# Patient Record
Sex: Male | Born: 1965 | Race: White | Hispanic: No | Marital: Married | Smoking: Never smoker
Health system: Southern US, Community
[De-identification: ages and names within clinical notes are randomized; demographics above are authoritative.]

## PROBLEM LIST (undated history)

## (undated) DIAGNOSIS — E119 Type 2 diabetes mellitus without complications: Secondary | ICD-10-CM

## (undated) DIAGNOSIS — E669 Obesity, unspecified: Secondary | ICD-10-CM

## (undated) DIAGNOSIS — M1990 Unspecified osteoarthritis, unspecified site: Secondary | ICD-10-CM

## (undated) DIAGNOSIS — I10 Essential (primary) hypertension: Secondary | ICD-10-CM

## (undated) DIAGNOSIS — I739 Peripheral vascular disease, unspecified: Secondary | ICD-10-CM

## (undated) DIAGNOSIS — E785 Hyperlipidemia, unspecified: Secondary | ICD-10-CM

## (undated) HISTORY — DX: Obesity, unspecified: E66.9

## (undated) HISTORY — PX: WISDOM TOOTH EXTRACTION: SHX21

## (undated) HISTORY — DX: Essential (primary) hypertension: I10

## (undated) HISTORY — DX: Hyperlipidemia, unspecified: E78.5

## (undated) HISTORY — DX: Type 2 diabetes mellitus without complications: E11.9

---

## 2000-03-08 ENCOUNTER — Encounter: Payer: Self-pay | Admitting: Family Medicine

## 2000-03-08 ENCOUNTER — Encounter: Admission: RE | Admit: 2000-03-08 | Discharge: 2000-03-08 | Payer: Self-pay | Admitting: Family Medicine

## 2002-07-06 ENCOUNTER — Emergency Department (HOSPITAL_COMMUNITY): Admission: EM | Admit: 2002-07-06 | Discharge: 2002-07-06 | Payer: Self-pay | Admitting: Emergency Medicine

## 2002-07-08 ENCOUNTER — Encounter: Payer: Self-pay | Admitting: Family Medicine

## 2002-07-08 ENCOUNTER — Ambulatory Visit (HOSPITAL_COMMUNITY): Admission: RE | Admit: 2002-07-08 | Discharge: 2002-07-08 | Payer: Self-pay | Admitting: Family Medicine

## 2003-08-11 ENCOUNTER — Encounter: Admission: RE | Admit: 2003-08-11 | Discharge: 2003-08-11 | Payer: Self-pay | Admitting: Family Medicine

## 2012-06-06 ENCOUNTER — Encounter: Payer: Self-pay | Admitting: Dietician

## 2012-06-06 ENCOUNTER — Encounter: Payer: BC Managed Care – PPO | Attending: Emergency Medicine | Admitting: Dietician

## 2012-06-06 VITALS — Ht 73.5 in | Wt 268.9 lb

## 2012-06-06 DIAGNOSIS — Z713 Dietary counseling and surveillance: Secondary | ICD-10-CM | POA: Insufficient documentation

## 2012-06-06 DIAGNOSIS — E119 Type 2 diabetes mellitus without complications: Secondary | ICD-10-CM

## 2012-06-06 NOTE — Progress Notes (Signed)
Medical Nutrition Therapy:  Appt start time: 0800 end time:  0900.   Assessment:  Primary concerns today: Learn what to eat to prevent having to take medications for diabetes/glucose. Comes with recent history of getting his blood pressure under better control using 3 medications.  Does have lower levels at home than those levels at the MD visit.  Has had a recent increase in his medication for cholesterol.  On 05/15/12 T. Chol=169, HDL= 50 mg, LDL= 96, TG= 115 mg.  HgA1C= 6.6%.    HYPERGLYCEMIA:  Gives no S/S of high blood glucose.  HYPOGLYCEMIA:  Notes no S/S of low blood glucose.  BLOOD GLUCOSE LEVELS:    Fasting: 112-115  After Dinner: 96, 90's-120's.  Did have one episode of post-meal of 190.   MEDICATIONS: Completed review of current medications.  Currently is not on medication for lowering blood glucose.   DIETARY INTAKE:  Usual eating pattern includes 3 meals and 2-3 snacks per day.  24-hr recall:  B ( AM): 7:00cereal  2 cups (raisn bran or multigrain cheerios, milk)  Skim milk 8 oz  Snk ( AM): Banana and coffee with sweet and low.and half and half L ( PM): [place] and cheese sandwich on wheat or white bread, with 100 calorie snack pack or the individual pretzel snack. OR salad or an entree OR fast food with [employer] and 2 cheese burgers and diet coke.  sometimes will go to the [facility] a couple times per week. And will grab fast food on his way back to work. Snk ( PM): Lance crackers or 100 calorie almonds. Water or a coffee. D ( PM): 7-8:00 chicken strips, hamburgers to grill chicken,  (bread, fruit, rice ) trying to do salad.  steak  Pizza at times. Pasta with chicken and sauce.  Tortilla wraps with lettuce, cheese. Water or diet soda. Snk ( PM): ice cream in past popcorn. Beverages: water, coffee, diet soda.  Usual physical activity: [employer] 2 days per week, 30 min on elyptical and stretches, especially for siatic nerve.  Some yard work using [employer], walk with daughter at  [employer] on the weekend.     Estimated energy needs: HT: 71.5 in  WT: 268.9 lb  BMI: 35.1 kg/m2  Adj WT:210 or 200 lb (91 kg) 1800 calories 200-205 g carbohydrates 135-140 g protein 55-60 g fat  Progress Towards Goal(s):  No progress.   Nutritional Diagnosis:  [place]-2.1 Inpaired nutrition utilization As related to blood glucose.  As evidenced by diagnosis of type 2 diabetes, HgA1c at 6.6%, and varying blood glucose levels..    Intervention:  Nutrition Review of the physiology of diabetes and the interventions for lowering blood glucose.  Recommended that he continue his weekly exercise at the [facility] and to try to extend the time to the ADA recommended 150 minutes/week.  Aim to spend time getting some walking or exercise or working activity in on the weekend.  Recommended adding lean protein source at all meals and snacks.  Try using the [employer] web site for fast food meals.  Use the food label and look to increase fiber and to limit sugar to (0-9 gm) per serving.  Try to limit carb servings to 4 servings or 60 gm per meal.  Consider setting a self-weight loss goal.   Handouts given during visit include:  Living Well with Diabetes  Controlling Blood Glucose  Yellow Card with Diet Prescription  Menu with 60 Gm CHO meals.  Snack list  Goal:  Lose some  weight.  Get blood glucose levels down and not go on another medication.  Monitoring/Evaluation:  Dietary intake, exercise, blood glucose levels, and body weight in 8-12 weeks.

## 2012-06-09 ENCOUNTER — Encounter: Payer: Self-pay | Admitting: Dietician

## 2015-06-03 ENCOUNTER — Encounter: Payer: Self-pay | Admitting: Internal Medicine

## 2015-08-03 ENCOUNTER — Ambulatory Visit: Payer: Self-pay | Admitting: *Deleted

## 2015-08-17 ENCOUNTER — Encounter: Payer: Self-pay | Admitting: Internal Medicine

## 2015-08-21 HISTORY — PX: COLONOSCOPY: SHX174

## 2015-09-23 ENCOUNTER — Ambulatory Visit (AMBULATORY_SURGERY_CENTER): Payer: Self-pay | Admitting: *Deleted

## 2015-09-23 VITALS — Ht 74.0 in | Wt 277.8 lb

## 2015-09-23 DIAGNOSIS — Z1211 Encounter for screening for malignant neoplasm of colon: Secondary | ICD-10-CM

## 2015-09-23 MED ORDER — NA SULFATE-K SULFATE-MG SULF 17.5-3.13-1.6 GM/177ML PO SOLN: 1.0000 | Freq: Once | ORAL | Status: DC

## 2015-09-23 NOTE — Progress Notes (Signed)
No egg or soy allergy known to patient  No issues with past sedation with any surgeries  or procedures, no intubation problems  No diet pills per patient No home 02 use per patient  No blood thinners per patient   emmi to    [email or web address]

## 2015-10-14 ENCOUNTER — Ambulatory Visit (AMBULATORY_SURGERY_CENTER): Payer: BLUE CROSS/BLUE SHIELD | Admitting: Internal Medicine

## 2015-10-14 ENCOUNTER — Encounter: Payer: Self-pay | Admitting: Internal Medicine

## 2015-10-14 VITALS — BP 141/93 | HR 60 | Temp 98.9°F | Resp 14 | Wt 277.0 lb

## 2015-10-14 DIAGNOSIS — D122 Benign neoplasm of ascending colon: Secondary | ICD-10-CM

## 2015-10-14 DIAGNOSIS — Z1211 Encounter for screening for malignant neoplasm of colon: Secondary | ICD-10-CM

## 2015-10-14 LAB — GLUCOSE, CAPILLARY
GLUCOSE-CAPILLARY: 115 mg/dL — AB (ref 65–99)
GLUCOSE-CAPILLARY: 101 mg/dL — AB (ref 65–99)

## 2015-10-14 MED ORDER — SODIUM CHLORIDE 0.9 % IV SOLN: 500.0000 mL | INTRAVENOUS | Status: DC

## 2015-10-14 NOTE — Progress Notes (Signed)
Report to PACU, RN, vss, BBS= Clear.  

## 2015-10-14 NOTE — Patient Instructions (Signed)
YOU HAD AN ENDOSCOPIC PROCEDURE TODAY AT THE [facility]:   Refer to the procedure report that was given to you for any specific questions about what was found during the examination.  If the procedure report does not answer your questions, please call your gastroenterologist to clarify.  If you requested that your care partner not be given the details of your procedure findings, then the procedure report has been included in a sealed envelope for you to review at your convenience later.  YOU SHOULD EXPECT: Some feelings of bloating in the abdomen. Passage of more gas than usual.  Walking can help get rid of the air that was put into your GI tract during the procedure and reduce the bloating. If you had a lower endoscopy (such as a colonoscopy or flexible sigmoidoscopy) you may notice spotting of blood in your stool or on the toilet paper. If you underwent a bowel prep for your procedure, you may not have a normal bowel movement for a few days.  Please Note:  You might notice some irritation and congestion in your nose or some drainage.  This is from the oxygen used during your procedure.  There is no need for concern and it should clear up in a day or so.  SYMPTOMS TO REPORT IMMEDIATELY:   Following lower endoscopy (colonoscopy or flexible sigmoidoscopy):  Excessive amounts of blood in the stool  Significant tenderness or worsening of abdominal pains  Swelling of the abdomen that is new, acute  Fever of 100F or higher   For urgent or emergent issues, a gastroenterologist can be reached at any hour by calling [phone].   DIET: Your first meal following the procedure should be a small meal and then it is ok to progress to your normal diet. Heavy or fried foods are harder to digest and may make you feel nauseous or bloated.  Likewise, meals heavy in dairy and vegetables can increase bloating.  Drink plenty of fluids but you should avoid alcoholic beverages for 24  hours.  ACTIVITY:  You should plan to take it easy for the rest of today and you should NOT DRIVE or use heavy machinery until tomorrow (because of the sedation medicines used during the test).    FOLLOW UP: Our staff will call the number listed on your records the next business day following your procedure to check on you and address any questions or concerns that you may have regarding the information given to you following your procedure. If we do not reach you, we will leave a message.  However, if you are feeling well and you are not experiencing any problems, there is no need to return our call.  We will assume that you have returned to your regular daily activities without incident.  If any biopsies were taken you will be contacted by phone or by letter within the next 1-3 weeks.  Please call us at [phone] if you have not heard about the biopsies in 3 weeks.    SIGNATURES/CONFIDENTIALITY: You and/or your care partner have signed paperwork which will be entered into your electronic medical record.  These signatures attest to the fact that that the information above on your After Visit Summary has been reviewed and is understood.  Full responsibility of the confidentiality of this discharge information lies with you and/or your care-partner.  Polyp, diverticulosis, and high fiber diet information given.  

## 2015-10-14 NOTE — Op Note (Signed)
[place]  [address] [place], [address]   COLONOSCOPY PROCEDURE REPORT  PATIENT: Gregory Khan, Gregory Khan  MR#: [id number] BIRTHDATE: 1965-10-10 , 49  yrs. old GENDER: male ENDOSCOPIST: Eustace Quail, MD REFERRED [id number] Kindl, M.D. PROCEDURE DATE:  10/14/2015 PROCEDURE:   Colonoscopy, screening and Colonoscopy with snare polypectomy x 1 First Screening Colonoscopy - Avg.  risk and is 50 yrs.  old or older Yes.  Prior Negative Screening - Now for repeat screening. N/A  History of Adenoma - Now for follow-up colonoscopy & has been > or = to 3 yrs.  N/A  Polyps removed today? Yes ASA CLASS:   Class II INDICATIONS:Screening for colonic neoplasia and Colorectal Neoplasm Risk Assessment for this procedure is average risk. MEDICATIONS: Monitored anesthesia care and Propofol 400 mg IV  DESCRIPTION OF PROCEDURE:   After the risks benefits and alternatives of the procedure were thoroughly explained, informed consent was obtained.  The digital rectal exam revealed no abnormalities of the rectum.   The LB [id number]  endoscope was introduced through the anus and advanced to the cecum, which was identified by both the appendix and ileocecal valve. No adverse events experienced.   The quality of the prep was excellent. (Suprep was used)  The instrument was then slowly withdrawn as the colon was fully examined. Estimated blood loss is zero unless otherwise noted in this procedure report.   COLON FINDINGS: A sessile polyp measuring 6 mm in size was found in the ascending colon.  A polypectomy was performed with a cold snare.  The resection was complete, the polyp tissue was completely retrieved and sent to histology.   There was moderate diverticulosis noted in the left colon.   The examination was otherwise normal.  Retroflexed views revealed internal hemorrhoids. The time to cecum = 3.7 Withdrawal time = 14.0   The scope was withdrawn and the procedure  completed. COMPLICATIONS: There were no immediate complications.  ENDOSCOPIC IMPRESSION: 1.   Sessile polyp was found in the ascending colon; polypectomy was performed with a cold snare 2.   Moderate diverticulosis was noted in the left colon 3.   The examination was otherwise normal  RECOMMENDATIONS: 1. Follow up colonoscopy in 5 years  eSigned:  Eustace Quail, MD 10/14/2015 12:31 PM   cc: The Patient and Ihor Gully, MD

## 2015-10-14 NOTE — Progress Notes (Signed)
Called to room to assist during endoscopic procedure.  Patient ID and intended procedure confirmed with present staff. Received instructions for my participation in the procedure from the performing physician.  

## 2015-10-17 ENCOUNTER — Telehealth: Payer: Self-pay

## 2015-10-17 NOTE — Telephone Encounter (Signed)
  Follow up Call-  Call back number 10/14/2015  Post procedure Call Back phone  # [phone]  Permission to leave phone message Yes     Patient was called after procedure on 10/14/2015. No answer at the number given for follow up phone call. A message was left on the answering machine.

## 2015-10-18 ENCOUNTER — Encounter: Payer: Self-pay | Admitting: Internal Medicine

## 2016-04-21 DIAGNOSIS — S83241A Other tear of medial meniscus, current injury, right knee, initial encounter: Secondary | ICD-10-CM | POA: Diagnosis not present

## 2016-04-30 DIAGNOSIS — S83282A Other tear of lateral meniscus, current injury, left knee, initial encounter: Secondary | ICD-10-CM | POA: Diagnosis not present

## 2016-05-13 DIAGNOSIS — M25562 Pain in left knee: Secondary | ICD-10-CM | POA: Diagnosis not present

## 2016-05-21 ENCOUNTER — Ambulatory Visit
Admission: RE | Admit: 2016-05-21 | Discharge: 2016-05-21 | Disposition: A | Discharge status: Home / Self Care | Payer: BLUE CROSS/BLUE SHIELD | Source: Ambulatory Visit | Attending: Family Medicine | Admitting: Family Medicine

## 2016-05-21 ENCOUNTER — Other Ambulatory Visit: Payer: Self-pay | Admitting: Family Medicine

## 2016-05-21 DIAGNOSIS — Z0000 Encounter for general adult medical examination without abnormal findings: Secondary | ICD-10-CM

## 2016-05-21 DIAGNOSIS — Z01818 Encounter for other preprocedural examination: Secondary | ICD-10-CM | POA: Diagnosis not present

## 2016-05-25 DIAGNOSIS — M94262 Chondromalacia, left knee: Secondary | ICD-10-CM | POA: Diagnosis not present

## 2016-05-25 DIAGNOSIS — M1712 Unilateral primary osteoarthritis, left knee: Secondary | ICD-10-CM | POA: Diagnosis not present

## 2016-05-25 DIAGNOSIS — S83242A Other tear of medial meniscus, current injury, left knee, initial encounter: Secondary | ICD-10-CM | POA: Diagnosis not present

## 2016-05-25 DIAGNOSIS — M23332 Other meniscus derangements, other medial meniscus, left knee: Secondary | ICD-10-CM | POA: Diagnosis not present

## 2016-05-25 DIAGNOSIS — M23362 Other meniscus derangements, other lateral meniscus, left knee: Secondary | ICD-10-CM | POA: Diagnosis not present

## 2016-05-25 DIAGNOSIS — G8918 Other acute postprocedural pain: Secondary | ICD-10-CM | POA: Diagnosis not present

## 2016-05-25 DIAGNOSIS — S83282A Other tear of lateral meniscus, current injury, left knee, initial encounter: Secondary | ICD-10-CM | POA: Diagnosis not present

## 2016-05-31 DIAGNOSIS — S83242D Other tear of medial meniscus, current injury, left knee, subsequent encounter: Secondary | ICD-10-CM | POA: Diagnosis not present

## 2016-05-31 DIAGNOSIS — S83282D Other tear of lateral meniscus, current injury, left knee, subsequent encounter: Secondary | ICD-10-CM | POA: Diagnosis not present

## 2016-06-28 DIAGNOSIS — S83242D Other tear of medial meniscus, current injury, left knee, subsequent encounter: Secondary | ICD-10-CM | POA: Diagnosis not present

## 2016-07-10 DIAGNOSIS — M1712 Unilateral primary osteoarthritis, left knee: Secondary | ICD-10-CM | POA: Diagnosis not present

## 2016-07-26 DIAGNOSIS — S83242D Other tear of medial meniscus, current injury, left knee, subsequent encounter: Secondary | ICD-10-CM | POA: Diagnosis not present

## 2016-07-26 DIAGNOSIS — S83282D Other tear of lateral meniscus, current injury, left knee, subsequent encounter: Secondary | ICD-10-CM | POA: Diagnosis not present

## 2016-08-06 DIAGNOSIS — M25561 Pain in right knee: Secondary | ICD-10-CM | POA: Diagnosis not present

## 2016-08-20 HISTORY — PX: KNEE ARTHROPLASTY: SHX992

## 2016-08-23 DIAGNOSIS — M25561 Pain in right knee: Secondary | ICD-10-CM | POA: Diagnosis not present

## 2016-09-12 DIAGNOSIS — M1711 Unilateral primary osteoarthritis, right knee: Secondary | ICD-10-CM | POA: Diagnosis not present

## 2016-09-12 DIAGNOSIS — Z96651 Presence of right artificial knee joint: Secondary | ICD-10-CM | POA: Diagnosis not present

## 2016-09-12 DIAGNOSIS — M179 Osteoarthritis of knee, unspecified: Secondary | ICD-10-CM | POA: Diagnosis not present

## 2016-09-17 DIAGNOSIS — M1711 Unilateral primary osteoarthritis, right knee: Secondary | ICD-10-CM | POA: Diagnosis not present

## 2016-09-17 DIAGNOSIS — M25561 Pain in right knee: Secondary | ICD-10-CM | POA: Diagnosis not present

## 2016-09-17 DIAGNOSIS — M25661 Stiffness of right knee, not elsewhere classified: Secondary | ICD-10-CM | POA: Diagnosis not present

## 2016-09-17 DIAGNOSIS — R262 Difficulty in walking, not elsewhere classified: Secondary | ICD-10-CM | POA: Diagnosis not present

## 2016-09-19 DIAGNOSIS — M25561 Pain in right knee: Secondary | ICD-10-CM | POA: Diagnosis not present

## 2016-09-19 DIAGNOSIS — R262 Difficulty in walking, not elsewhere classified: Secondary | ICD-10-CM | POA: Diagnosis not present

## 2016-09-19 DIAGNOSIS — M1711 Unilateral primary osteoarthritis, right knee: Secondary | ICD-10-CM | POA: Diagnosis not present

## 2016-09-19 DIAGNOSIS — M25661 Stiffness of right knee, not elsewhere classified: Secondary | ICD-10-CM | POA: Diagnosis not present

## 2016-09-21 DIAGNOSIS — M25561 Pain in right knee: Secondary | ICD-10-CM | POA: Diagnosis not present

## 2016-09-21 DIAGNOSIS — R262 Difficulty in walking, not elsewhere classified: Secondary | ICD-10-CM | POA: Diagnosis not present

## 2016-09-21 DIAGNOSIS — M25661 Stiffness of right knee, not elsewhere classified: Secondary | ICD-10-CM | POA: Diagnosis not present

## 2016-09-21 DIAGNOSIS — M1711 Unilateral primary osteoarthritis, right knee: Secondary | ICD-10-CM | POA: Diagnosis not present

## 2016-09-24 DIAGNOSIS — M1711 Unilateral primary osteoarthritis, right knee: Secondary | ICD-10-CM | POA: Diagnosis not present

## 2016-09-25 DIAGNOSIS — M1711 Unilateral primary osteoarthritis, right knee: Secondary | ICD-10-CM | POA: Diagnosis not present

## 2016-09-25 DIAGNOSIS — M25561 Pain in right knee: Secondary | ICD-10-CM | POA: Diagnosis not present

## 2016-09-25 DIAGNOSIS — R262 Difficulty in walking, not elsewhere classified: Secondary | ICD-10-CM | POA: Diagnosis not present

## 2016-09-25 DIAGNOSIS — M25661 Stiffness of right knee, not elsewhere classified: Secondary | ICD-10-CM | POA: Diagnosis not present

## 2016-09-28 DIAGNOSIS — M25661 Stiffness of right knee, not elsewhere classified: Secondary | ICD-10-CM | POA: Diagnosis not present

## 2016-09-28 DIAGNOSIS — M1711 Unilateral primary osteoarthritis, right knee: Secondary | ICD-10-CM | POA: Diagnosis not present

## 2016-09-28 DIAGNOSIS — M25561 Pain in right knee: Secondary | ICD-10-CM | POA: Diagnosis not present

## 2016-09-28 DIAGNOSIS — R262 Difficulty in walking, not elsewhere classified: Secondary | ICD-10-CM | POA: Diagnosis not present

## 2016-10-01 DIAGNOSIS — R262 Difficulty in walking, not elsewhere classified: Secondary | ICD-10-CM | POA: Diagnosis not present

## 2016-10-01 DIAGNOSIS — M1711 Unilateral primary osteoarthritis, right knee: Secondary | ICD-10-CM | POA: Diagnosis not present

## 2016-10-01 DIAGNOSIS — M25561 Pain in right knee: Secondary | ICD-10-CM | POA: Diagnosis not present

## 2016-10-01 DIAGNOSIS — M25661 Stiffness of right knee, not elsewhere classified: Secondary | ICD-10-CM | POA: Diagnosis not present

## 2016-10-03 DIAGNOSIS — M1711 Unilateral primary osteoarthritis, right knee: Secondary | ICD-10-CM | POA: Diagnosis not present

## 2016-10-03 DIAGNOSIS — R262 Difficulty in walking, not elsewhere classified: Secondary | ICD-10-CM | POA: Diagnosis not present

## 2016-10-03 DIAGNOSIS — M25561 Pain in right knee: Secondary | ICD-10-CM | POA: Diagnosis not present

## 2016-10-03 DIAGNOSIS — M25661 Stiffness of right knee, not elsewhere classified: Secondary | ICD-10-CM | POA: Diagnosis not present

## 2016-10-05 DIAGNOSIS — M25661 Stiffness of right knee, not elsewhere classified: Secondary | ICD-10-CM | POA: Diagnosis not present

## 2016-10-05 DIAGNOSIS — M25561 Pain in right knee: Secondary | ICD-10-CM | POA: Diagnosis not present

## 2016-10-05 DIAGNOSIS — M1711 Unilateral primary osteoarthritis, right knee: Secondary | ICD-10-CM | POA: Diagnosis not present

## 2016-10-05 DIAGNOSIS — R262 Difficulty in walking, not elsewhere classified: Secondary | ICD-10-CM | POA: Diagnosis not present

## 2016-10-10 DIAGNOSIS — M25661 Stiffness of right knee, not elsewhere classified: Secondary | ICD-10-CM | POA: Diagnosis not present

## 2016-10-10 DIAGNOSIS — M1711 Unilateral primary osteoarthritis, right knee: Secondary | ICD-10-CM | POA: Diagnosis not present

## 2016-10-10 DIAGNOSIS — M25561 Pain in right knee: Secondary | ICD-10-CM | POA: Diagnosis not present

## 2016-10-10 DIAGNOSIS — R262 Difficulty in walking, not elsewhere classified: Secondary | ICD-10-CM | POA: Diagnosis not present

## 2016-10-12 DIAGNOSIS — M25561 Pain in right knee: Secondary | ICD-10-CM | POA: Diagnosis not present

## 2016-10-12 DIAGNOSIS — M1711 Unilateral primary osteoarthritis, right knee: Secondary | ICD-10-CM | POA: Diagnosis not present

## 2016-10-12 DIAGNOSIS — M25661 Stiffness of right knee, not elsewhere classified: Secondary | ICD-10-CM | POA: Diagnosis not present

## 2016-10-12 DIAGNOSIS — R262 Difficulty in walking, not elsewhere classified: Secondary | ICD-10-CM | POA: Diagnosis not present

## 2016-10-15 DIAGNOSIS — M1711 Unilateral primary osteoarthritis, right knee: Secondary | ICD-10-CM | POA: Diagnosis not present

## 2016-10-16 DIAGNOSIS — M1711 Unilateral primary osteoarthritis, right knee: Secondary | ICD-10-CM | POA: Diagnosis not present

## 2016-10-16 DIAGNOSIS — M25561 Pain in right knee: Secondary | ICD-10-CM | POA: Diagnosis not present

## 2016-10-16 DIAGNOSIS — M25661 Stiffness of right knee, not elsewhere classified: Secondary | ICD-10-CM | POA: Diagnosis not present

## 2016-10-16 DIAGNOSIS — R262 Difficulty in walking, not elsewhere classified: Secondary | ICD-10-CM | POA: Diagnosis not present

## 2016-10-25 DIAGNOSIS — M25561 Pain in right knee: Secondary | ICD-10-CM | POA: Diagnosis not present

## 2016-10-25 DIAGNOSIS — R262 Difficulty in walking, not elsewhere classified: Secondary | ICD-10-CM | POA: Diagnosis not present

## 2016-10-25 DIAGNOSIS — M25661 Stiffness of right knee, not elsewhere classified: Secondary | ICD-10-CM | POA: Diagnosis not present

## 2016-10-25 DIAGNOSIS — M1711 Unilateral primary osteoarthritis, right knee: Secondary | ICD-10-CM | POA: Diagnosis not present

## 2016-11-12 DIAGNOSIS — Z96651 Presence of right artificial knee joint: Secondary | ICD-10-CM | POA: Diagnosis not present

## 2016-12-07 DIAGNOSIS — H40053 Ocular hypertension, bilateral: Secondary | ICD-10-CM | POA: Diagnosis not present

## 2016-12-10 DIAGNOSIS — M1712 Unilateral primary osteoarthritis, left knee: Secondary | ICD-10-CM | POA: Diagnosis not present

## 2016-12-10 DIAGNOSIS — Z96651 Presence of right artificial knee joint: Secondary | ICD-10-CM | POA: Diagnosis not present

## 2016-12-27 ENCOUNTER — Telehealth: Payer: Self-pay | Admitting: Cardiovascular Disease

## 2016-12-27 NOTE — Telephone Encounter (Signed)
Received records from [facility] for appointment on 01/15/17 with Dr Claiborne Billings.  Records put with Dr Evette Georges schedule for 01/15/17. lp

## 2016-12-31 ENCOUNTER — Ambulatory Visit: Payer: BLUE CROSS/BLUE SHIELD | Admitting: Cardiovascular Disease

## 2017-01-15 ENCOUNTER — Ambulatory Visit (INDEPENDENT_AMBULATORY_CARE_PROVIDER_SITE_OTHER): Payer: BLUE CROSS/BLUE SHIELD | Admitting: Cardiovascular Disease

## 2017-01-15 ENCOUNTER — Encounter: Payer: Self-pay | Admitting: Cardiovascular Disease

## 2017-01-15 VITALS — BP 126/98 | HR 66 | Ht 74.0 in | Wt 268.0 lb

## 2017-01-15 DIAGNOSIS — Z79899 Other long term (current) drug therapy: Secondary | ICD-10-CM | POA: Diagnosis not present

## 2017-01-15 DIAGNOSIS — E7800 Pure hypercholesterolemia, unspecified: Secondary | ICD-10-CM | POA: Diagnosis not present

## 2017-01-15 DIAGNOSIS — E785 Hyperlipidemia, unspecified: Secondary | ICD-10-CM | POA: Insufficient documentation

## 2017-01-15 DIAGNOSIS — E669 Obesity, unspecified: Secondary | ICD-10-CM | POA: Diagnosis not present

## 2017-01-15 DIAGNOSIS — I10 Essential (primary) hypertension: Secondary | ICD-10-CM | POA: Diagnosis not present

## 2017-01-15 DIAGNOSIS — E119 Type 2 diabetes mellitus without complications: Secondary | ICD-10-CM | POA: Diagnosis not present

## 2017-01-15 MED ORDER — SPIRONOLACTONE 25 MG PO TABS: ORAL_TABLET | ORAL | 6 refills | Status: DC

## 2017-01-15 NOTE — Patient Instructions (Signed)
Medication Instructions:   start new prescription for  spironolactone as directed on the bottle.  Labwork:  In 3-4 weeks at our office. Please fast for  This blood work. NO APPOINTMENT NEEDED.  Testing/Procedures:  Your physician has requested that you have an echocardiogram. Echocardiography is a painless test that uses sound waves to create images of your heart. It provides your doctor with information about the size and shape of your heart and how well your heart's chambers and valves are working. This procedure takes approximately one hour. There are no restrictions for this procedure.  This will be done at the [facility] unless other wise requested.  Follow-Up:  6-8 weeks  Any Other Special Instructions Will Be Listed Below (If Applicable).

## 2017-01-15 NOTE — Progress Notes (Signed)
Cardiology Office Note    Date:  01/17/2017   ID:  Gregory, Khan [birth year], MRN [id number]  PCP:  Gregory Fischer, MD  Cardiologist:  Gregory Majestic, MD   Chief Complaint  Patient presents with  . Follow-up    New patient.   Cardiology consultation through the referral of Dr. Leola Khan at [facility] for evaluation of hypertension History of Present Illness:  Gregory Khan is a 51 y.o. 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  He is referred by Dr. Leola Khan at [facility] for cardiology consultation and further evaluation of hypertension.  Mr. Gregory Khan admits to a 15 year history of hypertension as well as a three-year history of type 2 diabetes mellitus.  He also has a history of hyperlipidemia.  He has been followed by Gregory Khan and recently Dr. Leola Khan at [facility].  Remotely, he has been on Benicar HCT, amlodipine and metoprolol for hypertension.  Recently, his blood pressures have consistently been elevated and Benicar was discontinued and he is now on lisinopril/HCT 40/25 mg daily in addition to metoprolol 50 mg twice a day and amlodipine 10 mg daily.  Review of the records from [place] indicate that his blood pressures have been in the 140s to 761P with diastolics in the low 50D.  Laboratory in 2017 had shown a glucose of 144.  BUN 20, Cr 1.09.  Normal LFTs.  He has been on atorvastatin 20 mg and cholesterol was 161, triglycerides 77, HDL 62, LDL 84.  TSH was normal at 1.4.  Hemoglobin 15 and hematocrit 45.1.  Hemoglobin A1c in January 2018 at improved to 5.7.  Chest x-ray suggested my mild cardiac enlargement without mediastinal widening and was without active cardiac pulmonary disease.  Earlier this year, there was a blood pressure reading in January of 162/105.  He now presents for cardiology consultation and evaluation.  He denies any chest pain.  He had undergone right knee replacement surgery in January 2018 by Dr. Para Khan.  This has limited  his ambulation.  As result, he has noticed weight gain.  He is unaware of any palpitations.  He denies PND, orthopnea.   Past Medical History:  Diagnosis Date  . Diabetes mellitus without complication ([place])   . Hyperlipidemia   . Hypertension   . Obesity     Past Surgical History:  Procedure Laterality Date  . WISDOM TOOTH EXTRACTION      Current Medications: Outpatient Medications Prior to Visit  Medication Sig Dispense Refill  . amLODipine (NORVASC) 10 MG tablet Take 10 mg by mouth daily.    Marland Kitchen atorvastatin (LIPITOR) 20 MG tablet Take 20 mg by mouth daily.    Marland Kitchen glyBURIDE (DIABETA) 2.5 MG tablet Take 2.5 mg by mouth 2 (two) times daily with a meal.    . metoprolol (LOPRESSOR) 50 MG tablet Take 50 mg by mouth 2 (two) times daily.     . Multiple Vitamin (MULTIVITAMIN) tablet Take 1 tablet by mouth daily. Reported on 10/14/2015    . olmesartan (BENICAR) 40 MG tablet Take 40 mg by mouth daily.     No facility-administered medications prior to visit.      Allergies:   Patient has no known allergies.   Social History   Social History  . Marital status: Married    Spouse name: N/A  . Number of children: N/A  . Years of education: N/A   Social History Main Topics  . Smoking status: Never  Smoker  . Smokeless tobacco: Never Used  . Alcohol use 0.0 oz/week     Comment: socially  . Drug use: No  . Sexual activity: Not Asked   Other Topics Concern  . None   Social History Narrative  . 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  He does travel with work.  He is married for 24 years.  He has 2 children.  His undergraduate college was at [employer].  He does drink occasional wine and rum.  His exercise has been limited since his knee replacement, but previously had biked.  Family History:  The patient's family history includes Hypertension in his father.  His mother is currently living  at age 53.  His father died at 43 but was a smoker and had previously undergone CABG revascularization surgery.  He has 2 brothers, ages 77 and 78 who are alive and well.  He has 2 children, ages 42 and 60.  ROS General: Negative; No fevers, chills, or night sweats;  HEENT: Negative; No changes in vision or hearing, sinus congestion, difficulty swallowing Pulmonary: Negative; No cough, wheezing, shortness of breath, hemoptysis Cardiovascular: See history of present illness GI: Negative; No nausea, vomiting, diarrhea, or abdominal pain GU: Negative; No dysuria, hematuria, or difficulty voiding Musculoskeletal: Status post right knee replacement.  Gender 24 2018.  He may ultimately require left knee replacement Hematologic/Oncology: Negative; no easy bruising, bleeding Endocrine: Negative; no heat/cold intolerance; no diabetes Neuro: Negative; no changes in balance, headaches Skin: Negative; No rashes or skin lesions Psychiatric: Negative; No behavioral problems, depression Sleep: Negative; No snoring, daytime sleepiness, hypersomnolence, bruxism, restless legs, hypnogognic hallucinations, no cataplexy Other comprehensive 14 point system review is negative.   PHYSICAL EXAM:   VS:  BP (!) 126/98   Pulse 66   Ht 6\' 2"  (1.88 m)   Wt 268 lb (121.6 kg)   BMI 34.41 kg/m     Repeat blood pressure by me was 140/92  Wt Readings from Last 3 Encounters:  01/15/17 268 lb (121.6 kg)  10/14/15 277 lb (125.6 kg)  09/23/15 277 lb 12.8 oz (126 kg)    General: Alert, oriented, no distress.  Skin: normal turgor, no rashes, warm and dry HEENT: Normocephalic, atraumatic. Pupils equal round and reactive to light; sclera anicteric; extraocular muscles intact; Fundi Mild arterial narrowing.  No hemorrhages or exudates.  Disks flat Nose without nasal septal hypertrophy Mouth/Parynx benign; Mallinpatti scale 3 Neck: No JVD, no carotid bruits; normal carotid upstroke Lungs: clear to ausculatation and  percussion; no wheezing or rales Chest wall: without tenderness to palpitation Heart: PMI not displaced, RRR, s1 s2 normal, 1/6 systolic murmur, no diastolic murmur, no rubs, gallops, thrills, or heaves Abdomen: soft, nontender; no hepatosplenomehaly, BS+; abdominal aorta nontender and not dilated by palpation. Back: no CVA tenderness Pulses 2+ Musculoskeletal: full range of motion, normal strength, no joint deformities Extremities: no clubbing cyanosis or edema, Homan's sign negative  Neurologic: grossly nonfocal; Cranial nerves grossly wnl Psychologic: Normal mood and affec; normal cognition   Studies/Labs Reviewed:   EKG:  EKG is ordered today.  ECG (independently read by me): Normal sinus rhythm at 66 bpm.  Borderline first degree AV block with a PR interval at 204 ms.  No ST segment changes.  Nondiagnostic Q wave in lead 3.  Recent Labs: No flowsheet data found.   No flowsheet data found.  No flowsheet  data found. No results found for: MCV No results found for: TSH No results found for: HGBA1C   BNP No results found for: BNP  ProBNP No results found for: PROBNP   Lipid Panel  No results found for: CHOL, TRIG, HDL, CHOLHDL, VLDL, LDLCALC, LDLDIRECT   RADIOLOGY: No results found.   Additional studies/ records that were reviewed today include:  I reviewed the records from [facility] including office evaluations as well as laboratory.    ASSESSMENT:    1. Essential hypertension   2. Pure hypercholesterolemia   3. Medication management   4. Mild obesity   5. Type 2 diabetes mellitus without complication, without long-term current use of insulin [facility])      PLAN:  Gregory Khan is a 51 year old male who has a history of obesity with a BMI of 34.4, at least a 15 year history of hypertension and three-year history of type 2 diabetes mellitus.  Despite medical management, recently his blood pressure has continued to be elevated.  He currently is on a 4 drug  regimen consisting of amlodipine 10 mg, lisinopril/HCTZ 40/25 mg, and metoprolol tartrate 50 mg twice a day.  He is well beta blocked with his resting pulse in the 60s.  His blood pressure today continues to be elevated and when rechecked by me was 140/92.  He has had issues with recent diastolic hypertension.  I have recommended that he undergo an echo Doppler study to assess for hypertensive heart disease, and specifically looking at systolic and diastolic function.  I suspect he may have diastolic dysfunction based on his long-standing hypertension.  I am adding spironolactone for aldosterone blockade initially at 12.5 mg with probable need for titration to 25 mg daily.  His body mass index is 34, and weight loss and exercise is strongly recommended.  He had recently not been in exercise as he had in the past due to his recent knee surgery.  I am recommending follow-up laboratory be obtained in the fasting state.  With his diabetes mellitus, target LDL is less than 70.  He has been on atorvastatin 20 mg and his dose will be titrated if he is not at target.  If he continues to have significant blood pressure elevation, I will then refer him for a renal Doppler study to make certain there is no renovascular etiology.  We discussed potential sleep apnea contributing to her refractory hypertension.  At present, he believes he is sleeping well.  He denies gasping for breath.  He believes his sleep is restorative.  He is diabetic and currently is on glyburide 2.5 mg daily.  With his most recent hemoglobin A1c at 5.7.  I will see him in 6 weeks for reevaluation and further recommendations will be made at that time.   Medication Adjustments/Labs and Tests Ordered: Current medicines are reviewed at length with the patient today.  Concerns regarding medicines are outlined above.  Medication changes, Labs and Tests ordered today are listed in the Patient Instructions below. Patient Instructions  Medication  Instructions:   start new prescription for  spironolactone as directed on the bottle.  Labwork:  In 3-4 weeks at our office. Please fast for  This blood work. NO APPOINTMENT NEEDED.  Testing/Procedures:  Your physician has requested that you have an echocardiogram. Echocardiography is a painless test that uses sound waves to create images of your heart. It provides your doctor with information about the size and shape of your heart and how well your heart's chambers and  valves are working. This procedure takes approximately one hour. There are no restrictions for this procedure.  This will be done at the [facility] unless other wise requested.  Follow-Up:  6-8 weeks  Any Other Special Instructions Will Be Listed Below (If Applicable).      Signed, Gregory Majestic, MD  01/17/2017 1:35 PM    [facility] [address], [place], [place], [place]  [address] Phone: [phone]

## 2017-01-17 ENCOUNTER — Encounter: Payer: Self-pay | Admitting: Cardiovascular Disease

## 2017-01-17 DIAGNOSIS — M1712 Unilateral primary osteoarthritis, left knee: Secondary | ICD-10-CM | POA: Diagnosis not present

## 2017-01-29 ENCOUNTER — Other Ambulatory Visit: Payer: Self-pay

## 2017-01-29 ENCOUNTER — Ambulatory Visit (HOSPITAL_COMMUNITY): Payer: BLUE CROSS/BLUE SHIELD | Attending: Cardiology

## 2017-01-29 DIAGNOSIS — E669 Obesity, unspecified: Secondary | ICD-10-CM | POA: Insufficient documentation

## 2017-01-29 DIAGNOSIS — I10 Essential (primary) hypertension: Secondary | ICD-10-CM | POA: Diagnosis not present

## 2017-01-29 DIAGNOSIS — E785 Hyperlipidemia, unspecified: Secondary | ICD-10-CM | POA: Diagnosis not present

## 2017-01-29 DIAGNOSIS — Z6834 Body mass index (BMI) 34.0-34.9, adult: Secondary | ICD-10-CM | POA: Diagnosis not present

## 2017-01-29 DIAGNOSIS — E119 Type 2 diabetes mellitus without complications: Secondary | ICD-10-CM | POA: Diagnosis not present

## 2017-01-31 DIAGNOSIS — M545 Low back pain: Secondary | ICD-10-CM | POA: Diagnosis not present

## 2017-01-31 DIAGNOSIS — M25551 Pain in right hip: Secondary | ICD-10-CM | POA: Diagnosis not present

## 2017-02-06 ENCOUNTER — Telehealth: Payer: Self-pay | Admitting: Cardiovascular Disease

## 2017-02-06 NOTE — Telephone Encounter (Signed)
Patient was called back and verbalized understanding of echo results.   Notes recorded by Troy Sine, MD on 02/03/2017 at 4:13 PM EDT Normal LV function with moderate LVH. Mild aortic sclerosis. Mild dilation of the ascending aorta. Mild left atrium and right ventricle dilation.

## 2017-02-06 NOTE — Telephone Encounter (Signed)
Pt would like his Echo results from 01-29-17 please.

## 2017-02-07 DIAGNOSIS — M5416 Radiculopathy, lumbar region: Secondary | ICD-10-CM | POA: Diagnosis not present

## 2017-02-07 DIAGNOSIS — M25552 Pain in left hip: Secondary | ICD-10-CM | POA: Diagnosis not present

## 2017-02-07 DIAGNOSIS — R262 Difficulty in walking, not elsewhere classified: Secondary | ICD-10-CM | POA: Diagnosis not present

## 2017-02-14 DIAGNOSIS — Z79899 Other long term (current) drug therapy: Secondary | ICD-10-CM | POA: Diagnosis not present

## 2017-02-14 DIAGNOSIS — M25552 Pain in left hip: Secondary | ICD-10-CM | POA: Diagnosis not present

## 2017-02-14 DIAGNOSIS — I10 Essential (primary) hypertension: Secondary | ICD-10-CM | POA: Diagnosis not present

## 2017-02-14 DIAGNOSIS — E784 Other hyperlipidemia: Secondary | ICD-10-CM | POA: Diagnosis not present

## 2017-02-15 LAB — CBC
Hematocrit: 43.1 % (ref 37.5–51.0)
Hemoglobin: 14.5 g/dL (ref 13.0–17.7)
MCH: 29 pg (ref 26.6–33.0)
MCHC: 33.6 g/dL (ref 31.5–35.7)
MCV: 86 fL (ref 79–97)
PLATELETS: 262 10*3/uL (ref 150–379)
RBC: 5 x10E6/uL (ref 4.14–5.80)
RDW: 14.1 % (ref 12.3–15.4)
WBC: 6.2 10*3/uL (ref 3.4–10.8)

## 2017-02-15 LAB — COMPREHENSIVE METABOLIC PANEL
A/G RATIO: 1.8 (ref 1.2–2.2)
ALT: 54 IU/L — AB (ref 0–44)
AST: 24 IU/L (ref 0–40)
Albumin: 4.2 g/dL (ref 3.5–5.5)
Alkaline Phosphatase: 68 IU/L (ref 39–117)
BUN/Creatinine Ratio: 23 — ABNORMAL HIGH (ref 9–20)
BUN: 32 mg/dL — AB (ref 6–24)
Bilirubin Total: 0.6 mg/dL (ref 0.0–1.2)
CALCIUM: 9.4 mg/dL (ref 8.7–10.2)
CO2: 22 mmol/L (ref 20–29)
CREATININE: 1.42 mg/dL — AB (ref 0.76–1.27)
Chloride: 103 mmol/L (ref 96–106)
GFR calc Af Amer: 66 mL/min/{1.73_m2} (ref >59)
GFR, EST NON AFRICAN AMERICAN: 57 mL/min/{1.73_m2} — AB (ref >59)
GLUCOSE: 125 mg/dL — AB (ref 65–99)
Globulin, Total: 2.4 g/dL (ref 1.5–4.5)
Potassium: 4.9 mmol/L (ref 3.5–5.2)
Sodium: 139 mmol/L (ref 134–144)
TOTAL PROTEIN: 6.6 g/dL (ref 6.0–8.5)

## 2017-02-15 LAB — LIPID PANEL
CHOL/HDL RATIO: 2.5 ratio (ref 0.0–5.0)
Cholesterol, Total: 168 mg/dL (ref 100–199)
HDL: 66 mg/dL (ref >39)
LDL CALC: 84 mg/dL (ref 0–99)
Triglycerides: 90 mg/dL (ref 0–149)
VLDL CHOLESTEROL CAL: 18 mg/dL (ref 5–40)

## 2017-02-15 LAB — TSH: TSH: 1.88 u[IU]/mL (ref 0.450–4.500)

## 2017-02-25 ENCOUNTER — Ambulatory Visit: Payer: BLUE CROSS/BLUE SHIELD | Admitting: Cardiovascular Disease

## 2017-02-25 ENCOUNTER — Ambulatory Visit (INDEPENDENT_AMBULATORY_CARE_PROVIDER_SITE_OTHER): Payer: BLUE CROSS/BLUE SHIELD | Admitting: Cardiovascular Disease

## 2017-02-25 VITALS — BP 142/91 | HR 74 | Ht 74.0 in | Wt 271.0 lb

## 2017-02-25 DIAGNOSIS — N289 Disorder of kidney and ureter, unspecified: Secondary | ICD-10-CM

## 2017-02-25 DIAGNOSIS — Z79899 Other long term (current) drug therapy: Secondary | ICD-10-CM

## 2017-02-25 DIAGNOSIS — R7989 Other specified abnormal findings of blood chemistry: Secondary | ICD-10-CM

## 2017-02-25 DIAGNOSIS — R945 Abnormal results of liver function studies: Secondary | ICD-10-CM

## 2017-02-25 DIAGNOSIS — E669 Obesity, unspecified: Secondary | ICD-10-CM | POA: Diagnosis not present

## 2017-02-25 DIAGNOSIS — E119 Type 2 diabetes mellitus without complications: Secondary | ICD-10-CM

## 2017-02-25 DIAGNOSIS — E785 Hyperlipidemia, unspecified: Secondary | ICD-10-CM

## 2017-02-25 DIAGNOSIS — I10 Essential (primary) hypertension: Secondary | ICD-10-CM

## 2017-02-25 MED ORDER — SPIRONOLACTONE 25 MG PO TABS: 25.0000 mg | ORAL_TABLET | Freq: Once | ORAL | 6 refills | Status: DC

## 2017-02-25 NOTE — Patient Instructions (Signed)
Your physician has recommended you make the following change in your medication:   1.) the spironolactone has been increased from 1/2 tablet daily to 1 tablet daily.  Your physician recommends that you return for lab work in: 3 weeks here in our office. No appointment needed. You will not need to fast.  Your physician recommends that you schedule a follow-up appointment in: 3 months with Dr Claiborne Billings.

## 2017-02-26 NOTE — Progress Notes (Signed)
Cardiology Office Note    Date:  02/26/2017   ID:  Gregory Khan, Gregory Khan 1966/03/23, MRN [id number]  PCP:  Gregory Fischer, MD  Cardiologist:  Gregory Majestic, MD   Chief Complaint  Patient presents with  . Follow-up    pt denied chest pain and SOB    History of Present Illness:  Gregory Khan is a 51 y.o. 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  He was initially referred by Dr. Leola Khan at [facility] for cardiology consultation and further evaluation of hypertension.  He presents for a 6 week follow-up cardiology evaluation.  Mr. [employer] admits to a 15 year history of hypertension as well as a three-year history of type 2 diabetes mellitus.  He also has a history of hyperlipidemia.  He has been followed by Dr. Melanee Khan and recently Dr. Leola Khan at [facility].  Remotely, he has been on Benicar HCT, amlodipine and metoprolol for hypertension.  Recently, his blood pressures have consistently been elevated and Benicar was discontinued and he is now on lisinopril/HCT 40/25 mg daily in addition to metoprolol 50 mg twice a day and amlodipine 10 mg daily.  Review of the records from [employer] indicate that his blood pressures have been in the 140s to 494W with diastolics in the low 96P.  Laboratory in 2017 had shown a glucose of 144.  BUN 20, Cr 1.09.  Normal LFTs.  He has been on atorvastatin 20 mg and cholesterol was 161, triglycerides 77, HDL 62, LDL 84.  TSH was normal at 1.4.  Hemoglobin 15 and hematocrit 45.1.  Hemoglobin A1c in January 2018 at improved to 5.7.  Chest x-ray suggested my mild cardiac enlargement without mediastinal widening and was without active cardiac pulmonary disease.  Earlier this year, there was a blood pressure reading in January of 162/105.    When I initially saw him, his blood pressure remained elevated.  It was long-standing hypertension.  I added spironolactone for aldosterone blockade initially at 12.5 mg.   He has felt improved on  this.  I also scheduled him for an echo Doppler study which was done on 01/29/2017.  This showed moderate LVH.  He had normal systolic function.  Diastolic parameters were normal.  There was  Mild aortic root dilation.  His left atrium was mildly dilated.  He underwent subsequent laboratory.  Hemoglobin and hematocrit were stable.  He has mild renal insufficiency.  His most recent creatinine was 1.42.  ALT was minimally increased at 54 with a normal AST at 24.  He has felt improved.  He just returned from [employer] where he had a work in [place] in [place].  He tells me he is tentatively scheduled to undergo left knee replacement surgery in August 2018 with Dr. Noemi Khan.  Remotely he had undergone right knee replacement successfully without cardiovascular problems.  He presents for reevaluation.  Past Medical History:  Diagnosis Date  . Diabetes mellitus without complication ([place])   . Hyperlipidemia   . Hypertension   . Obesity     Past Surgical History:  Procedure Laterality Date  . WISDOM TOOTH EXTRACTION      Current Medications: Outpatient Medications Prior to Visit  Medication Sig Dispense Refill  . amLODipine (NORVASC) 10 MG tablet Take 10 mg by mouth daily.    Marland Kitchen atorvastatin (LIPITOR) 20 MG tablet Take 20 mg by mouth daily.    Marland Kitchen glyBURIDE (DIABETA) 2.5 MG tablet Take 2.5 mg by mouth 2 (  two) times daily with a meal.    . lisinopril-hydrochlorothiazide (PRINZIDE,ZESTORETIC) 20-12.5 MG tablet Take 2 tablets by mouth daily.     . metoprolol (LOPRESSOR) 50 MG tablet Take 50 mg by mouth 2 (two) times daily.     Marland Kitchen spironolactone (ALDACTONE) 25 MG tablet Take 1/2  To 1 tablet daily 30 tablet 6   No facility-administered medications prior to visit.      Allergies:   Patient has no known allergies.   Social History   Social History  . Marital status: Married    Spouse name: N/A  . Number of children: N/A  . Years of education: N/A   Social History Main Topics  . Smoking status:  Never Smoker  . Smokeless tobacco: Never Used  . Alcohol use 0.0 oz/week     Comment: socially  . Drug use: No  . Sexual activity: Not on file   Other Topics Concern  . Not on file   Social History Narrative  . 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  He does travel with work.  He is married for 24 years.  He has 2 children.  His undergraduate college was at [employer].  He does drink occasional wine and rum.  His exercise has been limited since his knee replacement, but previously had biked.  Family History:  The patient's family history includes Hypertension in his father.  His mother is currently living at age 72.  His father died at 15 but was a smoker and had previously undergone CABG revascularization surgery.  He has 2 brothers, ages 11 and 3 who are alive and well.  He has 2 children, ages 67 and 29.  ROS General: Negative; No fevers, chills, or night sweats;  HEENT: Negative; No changes in vision or hearing, sinus congestion, difficulty swallowing Pulmonary: Negative; No cough, wheezing, shortness of breath, hemoptysis Cardiovascular: See history of present illness GI: Negative; No nausea, vomiting, diarrhea, or abdominal pain GU: Negative; No dysuria, hematuria, or difficulty voiding Musculoskeletal: Status post right knee replacement in January 2018.  He will require left knee replacement Hematologic/Oncology: Negative; no easy bruising, bleeding Endocrine: Negative; no heat/cold intolerance; no diabetes Neuro: Negative; no changes in balance, headaches Skin: Negative; No rashes or skin lesions Psychiatric: Negative; No behavioral problems, depression Sleep: Negative; No snoring, daytime sleepiness, hypersomnolence, bruxism, restless legs, hypnogognic hallucinations, no cataplexy Other comprehensive 14 point system review is negative.   PHYSICAL EXAM:   VS:   BP (!) 142/91   Pulse 74   Ht _0  (1.88 m)   Wt 271 lb (122.9 kg)   BMI 34.79 kg/m     Repeat blood pressure by me was 136/88  Wt Readings from Last 3 Encounters:  02/25/17 271 lb (122.9 kg)  01/15/17 268 lb (121.6 kg)  10/14/15 277 lb (125.6 kg)    General: Alert, oriented, no distress.  Skin: normal turgor, no rashes, warm and dry HEENT: Normocephalic, atraumatic. Pupils equal round and reactive to light; sclera anicteric; extraocular muscles intact; Fundi mild arteriolar narrowing previously noted, disc were flat.  There were no hemorrhages or exudates. Nose without nasal septal hypertrophy Mouth/Parynx benign; Mallinpatti scale 3 Neck: No JVD, no carotid bruits; normal carotid upstroke Lungs: clear to ausculatation and percussion; no wheezing or rales Chest wall: without tenderness to palpitation Heart: PMI not displaced, RRR, s1 s2 normal, 1/6 systolic  murmur, no diastolic murmur, no rubs, gallops, thrills, or heaves Abdomen: soft, nontender; no hepatosplenomehaly, BS+; abdominal aorta nontender and not dilated by palpation. Back: no CVA tenderness Pulses 2+ Musculoskeletal: full range of motion, normal strength, no joint deformities Extremities: no clubbing cyanosis or edema, Homan's sign negative  Neurologic: grossly nonfocal; Cranial nerves grossly wnl Psychologic: Normal mood and affect; normal cognition    Studies/Labs Reviewed:   EKG:  EKG is ordered today.  ECG (independently read by me): Normal sinus rhythm at 74 bpm.  Normal intervals.  No ST segment changes.  No ectopy.  May 2018 ECG (independently read by me): Normal sinus rhythm at 66 bpm.  Borderline first degree AV block with a PR interval at 204 ms.  No ST segment changes.  Nondiagnostic Q wave in lead 3.  Recent Labs: BMP Latest Ref Rng & Units 02/14/2017  Glucose 65 - 99 mg/dL 125(H)  BUN 6 - 24 mg/dL 32(H)  Creatinine 0.76 - 1.27 mg/dL 1.42(H)  BUN/Creat Ratio 9 - 20 23(H)  Sodium 134 - 144 mmol/L  139  Potassium 3.5 - 5.2 mmol/L 4.9  Chloride 96 - 106 mmol/L 103  CO2 20 - 29 mmol/L 22  Calcium 8.7 - 10.2 mg/dL 9.4     Hepatic Function Latest Ref Rng & Units 02/14/2017  Total Protein 6.0 - 8.5 g/dL 6.6  Albumin 3.5 - 5.5 g/dL 4.2  AST 0 - 40 IU/L 24  ALT 0 - 44 IU/L 54(H)  Alk Phosphatase 39 - 117 IU/L 68  Total Bilirubin 0.0 - 1.2 mg/dL 0.6    CBC Latest Ref Rng & Units 02/14/2017  WBC 3.4 - 10.8 x10E3/uL 6.2  Hemoglobin 13.0 - 17.7 g/dL 14.5  Hematocrit 37.5 - 51.0 % 43.1  Platelets 150 - 379 x10E3/uL 262   Lab Results  Component Value Date   MCV 86 02/14/2017   Lab Results  Component Value Date   TSH 1.880 02/14/2017   No results found for: HGBA1C   BNP No results found for: BNP  ProBNP No results found for: PROBNP   Lipid Panel     Component Value Date/Time   CHOL 168 02/14/2017 0838   TRIG 90 02/14/2017 0838   HDL 66 02/14/2017 0838   CHOLHDL 2.5 02/14/2017 0838   LDLCALC 84 02/14/2017 0838     RADIOLOGY: No results found.   Additional studies/ records that were reviewed today include:  I reviewed the records from [facility] including office evaluations as well as laboratory.  ECHO Study Conclusions: 01/29/17  - Left ventricle: The cavity size was normal. Wall thickness was   increased in a pattern of moderate LVH. Systolic function was   normal. The estimated ejection fraction was in the range of 60%   to 65%. Wall motion was normal; there were no regional wall   motion abnormalities. There was no evidence of elevated   ventricular filling pressure by Doppler parameters. - Aortic valve: Trileaflet; mildly thickened, mildly calcified   leaflets. - Aorta: Aortic root dimension: 43 mm (ED). - Ascending aorta: The ascending aorta was mildly dilated. - Left atrium: The atrium was mildly dilated. - Right ventricle: The cavity size was mildly dilated. Wall   thickness was normal.  ASSESSMENT:    1. Benign hypertension   2. Medication  management      PLAN:  Mr. Smayan Hackbart is a 51 year old male who has a history of obesity with a BMI of 34.79, at least a 15 year history of hypertension  and three-year history of type 2 diabetes mellitus.  Despite medical management, recently his blood pressure has continued to be elevated.  When I initially saw him, he already was on amlodipine 10 mg, lisinopril HCT 40/25 mg, metoprolol 50 g twice a day and I recommended the addition of spironolactone initially at 12.5 mg daily.  He believes this additional therapy has significantly improved.  His blood pressure.  His blood pressure today continues to be mildly increased based on new hypertensive guideline criteria.  I am recommending slight additional titration up to 25 mg.  He is not having any chest pain.  He is not short of breath.  He is not having palpitations.  With his diabetes mellitus, target LDL is less than 70.  He has been on atorvastatin 20 mg. His most recent lipid panel showed an LDL of 84.  He will try to adjust his diet.  Recent laboratory had shown very mild elevation of ALT at 54, and I will not increase atorvastatin presently.  He had recently been traveling extensively for work.  With his mild renal insufficiency and mild LFT elevation.  I will recheck a  Cmet in 3 weeks.   At his initial visit, we discussed potential sleep apnea contributing to her refractory hypertension.  At present, he believes he is sleeping well.  He denies gasping for breath.  He believes his sleep is restorative.  He is diabetic and currently is on glyburide 2.5 mg daily and  most recent hemoglobin A1c at 5.7.  I will then cardiology clearance to undergo his left knee elective surgery with Dr. Para March in August.  I will see him in 3 months for follow-up evaluation.  Medication Adjustments/Labs and Tests Ordered: Current medicines are reviewed at length with the patient today.  Concerns regarding medicines are outlined above.  Medication changes, Labs and Tests  ordered today are listed in the Patient Instructions below. Patient Instructions  Your physician has recommended you make the following change in your medication:   1.) the spironolactone has been increased from 1/2 tablet daily to 1 tablet daily.  Your physician recommends that you return for lab work in: 3 weeks here in our office. No appointment needed. You will not need to fast.  Your physician recommends that you schedule a follow-up appointment in: 3 months with Dr Claiborne Billings.       Signed, Gregory Majestic, MD  02/26/2017 6:31 PM    [facility] [address], [place], [place], [place]  [address] Phone: [phone]

## 2017-02-27 ENCOUNTER — Encounter: Payer: Self-pay | Admitting: Cardiovascular Disease

## 2017-02-28 DIAGNOSIS — M5416 Radiculopathy, lumbar region: Secondary | ICD-10-CM | POA: Diagnosis not present

## 2017-02-28 DIAGNOSIS — M25552 Pain in left hip: Secondary | ICD-10-CM | POA: Diagnosis not present

## 2017-02-28 DIAGNOSIS — R262 Difficulty in walking, not elsewhere classified: Secondary | ICD-10-CM | POA: Diagnosis not present

## 2017-03-06 DIAGNOSIS — M5416 Radiculopathy, lumbar region: Secondary | ICD-10-CM | POA: Diagnosis not present

## 2017-03-06 DIAGNOSIS — M25552 Pain in left hip: Secondary | ICD-10-CM | POA: Diagnosis not present

## 2017-03-06 DIAGNOSIS — R262 Difficulty in walking, not elsewhere classified: Secondary | ICD-10-CM | POA: Diagnosis not present

## 2017-03-20 DIAGNOSIS — M25552 Pain in left hip: Secondary | ICD-10-CM | POA: Diagnosis not present

## 2017-03-20 DIAGNOSIS — M25562 Pain in left knee: Secondary | ICD-10-CM | POA: Diagnosis not present

## 2017-03-26 ENCOUNTER — Telehealth: Payer: Self-pay | Admitting: Cardiovascular Disease

## 2017-03-26 NOTE — Telephone Encounter (Signed)
New message     Pt states had lab work at [facility] on 03/20/17 and it shows his kidney function is way up, needs to be seen next week per Patient, he can not wait until the following week to see APP.  Pt is  in [place] if no answer leave message on voicemail.    Offered to check [address] for opening with APP and pt declined.

## 2017-03-26 NOTE — Telephone Encounter (Signed)
Spoke with patient and he saw orthopedist who did labs Per patient BUN went from 32 to 37 and Cr 1.42 to 1.50 Orthopedist recommended patient be seen by cardiologist for possible medication adjustments. Patient stated he could tell since increasing the Aldactone from 1/2 tablet to full tablet has noticed cramping in legs. He did not know his K+ level. He was taking Diclofenac which he was instructed to stop. He would like for Dr Claiborne Billings to review his medications and possibly make adjustments until he can get an appointment, does not want to see PA/NP. Will forward to Dr Claiborne Billings for review

## 2017-03-26 NOTE — Telephone Encounter (Signed)
Left message for patient to call, per the patient's request. There are not any appointment's available next week. He has been advised to call back with the labs he is concerned about and that he may also see his PCP if needed.

## 2017-03-29 DIAGNOSIS — Z79899 Other long term (current) drug therapy: Secondary | ICD-10-CM | POA: Diagnosis not present

## 2017-03-29 DIAGNOSIS — I10 Essential (primary) hypertension: Secondary | ICD-10-CM | POA: Diagnosis not present

## 2017-03-29 LAB — COMPREHENSIVE METABOLIC PANEL
ALK PHOS: 86 IU/L (ref 39–117)
ALT: 39 IU/L (ref 0–44)
AST: 23 IU/L (ref 0–40)
Albumin/Globulin Ratio: 1.6 (ref 1.2–2.2)
Albumin: 4.4 g/dL (ref 3.5–5.5)
BILIRUBIN TOTAL: 0.6 mg/dL (ref 0.0–1.2)
BUN/Creatinine Ratio: 15 (ref 9–20)
BUN: 23 mg/dL (ref 6–24)
CO2: 21 mmol/L (ref 20–29)
CREATININE: 1.5 mg/dL — AB (ref 0.76–1.27)
Calcium: 9.7 mg/dL (ref 8.7–10.2)
Chloride: 97 mmol/L (ref 96–106)
GFR calc Af Amer: 61 mL/min/{1.73_m2} (ref >59)
GFR calc non Af Amer: 53 mL/min/{1.73_m2} — ABNORMAL LOW (ref >59)
GLOBULIN, TOTAL: 2.7 g/dL (ref 1.5–4.5)
Glucose: 168 mg/dL — ABNORMAL HIGH (ref 65–99)
POTASSIUM: 4.6 mmol/L (ref 3.5–5.2)
SODIUM: 136 mmol/L (ref 134–144)
Total Protein: 7.1 g/dL (ref 6.0–8.5)

## 2017-03-29 NOTE — Telephone Encounter (Signed)
Agree to DC Nonsteroidal anti-inflammatory.  If he needs to be seen prior to LAD orthopedic surgery add onto my schedule for Tuesday, August 14, and if not, then on Monday, August 13.

## 2017-03-29 NOTE — Telephone Encounter (Signed)
Received call back from patient, reports he had blood work this AM ordered by [name].   Advised if results today, I can have [name] review.  If does not results today, this can be addressed at [facility] on Monday 8/13 at [facility].   Patient aware and verbalized understanding.

## 2017-03-29 NOTE — Telephone Encounter (Signed)
Notified by checkout staff patient was in office to have labs drawn and was informed [name] would call him today with results.   Called patient and left message (ok per DPR) with [name] recommendations.  Also advised that labs were not ordered by [name], therefore results would not be sent to him.    Advised to return call to inform of day he can come in for OV.

## 2017-04-01 ENCOUNTER — Encounter: Payer: Self-pay | Admitting: Cardiovascular Disease

## 2017-04-01 ENCOUNTER — Ambulatory Visit (INDEPENDENT_AMBULATORY_CARE_PROVIDER_SITE_OTHER): Payer: BLUE CROSS/BLUE SHIELD | Admitting: Cardiovascular Disease

## 2017-04-01 VITALS — BP 130/88 | HR 67 | Ht 74.5 in | Wt 271.2 lb

## 2017-04-01 DIAGNOSIS — N183 Chronic kidney disease, stage 3 unspecified: Secondary | ICD-10-CM

## 2017-04-01 DIAGNOSIS — I10 Essential (primary) hypertension: Secondary | ICD-10-CM

## 2017-04-01 DIAGNOSIS — E669 Obesity, unspecified: Secondary | ICD-10-CM

## 2017-04-01 DIAGNOSIS — E66811 Obesity, class 1: Secondary | ICD-10-CM

## 2017-04-01 DIAGNOSIS — Z79899 Other long term (current) drug therapy: Secondary | ICD-10-CM

## 2017-04-01 DIAGNOSIS — Z01818 Encounter for other preprocedural examination: Secondary | ICD-10-CM

## 2017-04-01 DIAGNOSIS — E119 Type 2 diabetes mellitus without complications: Secondary | ICD-10-CM | POA: Diagnosis not present

## 2017-04-01 MED ORDER — LISINOPRIL 40 MG PO TABS: 40.0000 mg | ORAL_TABLET | Freq: Every day | ORAL | 3 refills | Status: DC

## 2017-04-01 NOTE — Patient Instructions (Signed)
Your physician has recommended you make the following change in your medication:   1.) STOP lisinopril-HCT.  This has been replaced with lisinopril 40 mg. continue spironolactone.   Your physician recommends that you return for lab work on Friday.  Keep October appointment.

## 2017-04-01 NOTE — Progress Notes (Signed)
Cardiology Office Note    Date:  04/01/2017   ID:  Gregory, Khan 07-26-1966, MRN [id number]  PCP:  Gregory Fischer, MD  Cardiologist:  Gregory Majestic, MD   Follow-up office visit and preop assessment.  History of Present Illness:  Gregory Khan is a 51 y.o. 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  He was initially referred by Dr. Leola Khan at [facility] for cardiology consultation and further evaluation of hypertension.  He presents for a 6 week follow-up cardiology evaluation.  Gregory Khan admits to a 15 year history of hypertension as well as a three-year history of type 2 diabetes mellitus.  He also has a history of hyperlipidemia.  He has been followed by Dr. Melanee Spry and recently Dr. Leola Khan at [place].  Remotely, he has been on Benicar HCT, amlodipine and metoprolol for hypertension.  Recently, his blood pressures have consistently been elevated and Benicar was discontinued and he is now on lisinopril/HCT 40/25 mg daily in addition to metoprolol 50 mg twice a day and amlodipine 10 mg daily.  Review of the records from [place] indicate that his blood pressures have been in the 140s to 482L with diastolics in the low 07E.  Laboratory in 2017 had shown a glucose of 144.  BUN 20, Cr 1.09.  Normal LFTs.  He has been on atorvastatin 20 mg and cholesterol was 161, triglycerides 77, HDL 62, LDL 84.  TSH was normal at 1.4.  Hemoglobin 15 and hematocrit 45.1.  Hemoglobin A1c in January 2018 at improved to 5.7.  Chest x-ray suggested my mild cardiac enlargement without mediastinal widening and was without active cardiac pulmonary disease.  Earlier this year, there was a blood pressure reading in January of 162/105.    When I initially saw him, his blood pressure remained elevated.  With his long-standing hypertension I added  spironolactone for aldosterone blockade initially at 12.5 mg.   He has felt improved on this.  I also scheduled him for an echo  Doppler study which was done on 01/29/2017.  This showed moderate LVH.  He had normal systolic function.  Diastolic parameters were normal.  There was mild aortic root dilation.  His left atrium was mildly dilated.  He underwent subsequent laboratory.  Hemoglobin and hematocrit were stable.  He has mild renal insufficiency.  His most recent creatinine was 1.42.  ALT was minimally increased at 54 with a normal AST at 24.  He has felt improved.  When I last saw him, he had just returned from [place] and worked both in [place] in [place].  At that office visit, I recommended further titration of spironolactone to 25 mg daily since his blood pressure was improved, but remained elevated.  Recently, he had also been taking Voltaren.  He underwent follow-up labwork last week, which now showed a BUN that had risen by his report and 37 and creatinine at 1.50.  He was called by Dr. Laureen Abrahams office.  Voltaren was discontinued.  Repeat chemistry on August 10 showed a BUN had improved to 23 and his creatinine remained at 1.5.  LFTs were normal.  He feels the spironolactone has controlled his blood pressure.  He could sense greater diuretic effect.  He denies chest pain.  He denies palpitations.  He presents today for reevaluation.  Past Medical History:  Diagnosis Date  . Diabetes mellitus without complication ([place])   . Hyperlipidemia   . Hypertension   . Obesity  Past Surgical History:  Procedure Laterality Date  . WISDOM TOOTH EXTRACTION      Current Medications: Outpatient Medications Prior to Visit  Medication Sig Dispense Refill  . amLODipine (NORVASC) 10 MG tablet Take 10 mg by mouth daily.    Marland Kitchen atorvastatin (LIPITOR) 20 MG tablet Take 20 mg by mouth daily.    Marland Kitchen glyBURIDE (DIABETA) 2.5 MG tablet Take 2.5 mg by mouth 2 (two) times daily with a meal.    . metoprolol (LOPRESSOR) 50 MG tablet Take 50 mg by mouth 2 (two) times daily.     Marland Kitchen lisinopril-hydrochlorothiazide (PRINZIDE,ZESTORETIC) 20-12.5  MG tablet Take 2 tablets by mouth daily.     Marland Kitchen spironolactone (ALDACTONE) 25 MG tablet Take 1 tablet (25 mg total) by mouth once. 30 tablet 6   No facility-administered medications prior to visit.      Allergies:   Patient has no known allergies.   Social History   Social History  . Marital status: Married    Spouse name: N/A  . Number of children: N/A  . Years of education: N/A   Social History Main Topics  . Smoking status: Never Smoker  . Smokeless tobacco: Never Used  . Alcohol use 0.0 oz/week     Comment: socially  . Drug use: No  . Sexual activity: Not Asked   Other Topics Concern  . None   Social History Narrative  . 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  He does travel with work.  He is married for 24 years.  He has 2 children.  His undergraduate college was at [employer].  He does drink occasional wine and rum.  His exercise has been limited since his knee replacement, but previously had biked.  Family History:  The patient's family history includes Hypertension in his father.  His mother is currently living at age 92.  His father died at 73 but was a smoker and had previously undergone CABG revascularization surgery.  He has 2 brothers, ages 57 and 73 who are alive and well.  He has 2 children, ages 66 and 66.  ROS General: Negative; No fevers, chills, or night sweats;  HEENT: Negative; No changes in vision or hearing, sinus congestion, difficulty swallowing Pulmonary: Negative; No cough, wheezing, shortness of breath, hemoptysis Cardiovascular: See history of present illness GI: Negative; No nausea, vomiting, diarrhea, or abdominal pain GU: Negative; No dysuria, hematuria, or difficulty voiding Musculoskeletal: Status post right knee replacement in January 2018.  He will require left knee replacement Hematologic/Oncology: Negative; no easy bruising,  bleeding Endocrine: Negative; no heat/cold intolerance; no diabetes Neuro: Negative; no changes in balance, headaches Skin: Negative; No rashes or skin lesions Psychiatric: Negative; No behavioral problems, depression Sleep: Negative; No snoring, daytime sleepiness, hypersomnolence, bruxism, restless legs, hypnogognic hallucinations, no cataplexy Other comprehensive 14 point system review is negative.   PHYSICAL EXAM:   VS:  BP 130/88   Pulse 67   Ht 6' 2.5" (1.892 m)   Wt 271 lb 3.2 oz (123 kg)   BMI 34.35 kg/m     Repeat blood pressure by me was 130/88  Wt Readings from Last 3 Encounters:  04/01/17 271 lb 3.2 oz (123 kg)  02/25/17 271 lb (122.9 kg)  01/15/17 268 lb (121.6 kg)    General: Alert, oriented, no distress.  Skin: normal turgor, no rashes, warm and dry HEENT: Normocephalic, atraumatic. Pupils equal  round and reactive to light; sclera anicteric; extraocular muscles intact; Fundi not done today, but previously he was noted to have mild arteriolar narrowing with flat discs without hemorrhages or exudates. Nose without nasal septal hypertrophy Mouth/Parynx benign; Mallinpatti scale 3 Neck: No JVD, no carotid bruits; normal carotid upstroke Lungs: clear to ausculatation and percussion; no wheezing or rales Chest wall: without tenderness to palpitation Heart: PMI not displaced, RRR, s1 s2 normal, 1/6 systolic murmur, no diastolic murmur, no rubs, gallops, thrills, or heaves Abdomen: soft, nontender; no hepatosplenomehaly, BS+; abdominal aorta nontender and not dilated by palpation. Back: no CVA tenderness Pulses 2+ Musculoskeletal: full range of motion, normal strength, no joint deformities Extremities: no clubbing cyanosis or edema, Homan's sign negative  Neurologic: grossly nonfocal; Cranial nerves grossly wnl Psychologic: Normal mood and affect   Studies/Labs Reviewed:   EKG:  EKG is ordered today.  ECG (independently read by me): Normal sinus rhythm at 67 bpm.   Borderline first-degree AV block with a PR interval of 206 ms.  TC interval normal at 388 ms.  No ST segment changes.  Small Q wave in lead 3, nondiagnostic  July 2018 ECG (independently read by me): Normal sinus rhythm at 74 bpm.  Normal intervals.  No ST segment changes.  No ectopy.  May 2018 ECG (independently read by me): Normal sinus rhythm at 66 bpm.  Borderline first degree AV block with a PR interval at 204 ms.  No ST segment changes.  Nondiagnostic Q wave in lead 3.  Recent Labs: BMP Latest Ref Rng & Units 03/29/2017 02/14/2017  Glucose 65 - 99 mg/dL 168(H) 125(H)  BUN 6 - 24 mg/dL 23 32(H)  Creatinine 0.76 - 1.27 mg/dL 1.50(H) 1.42(H)  BUN/Creat Ratio 9 - 20 15 23(H)  Sodium 134 - 144 mmol/L 136 139  Potassium 3.5 - 5.2 mmol/L 4.6 4.9  Chloride 96 - 106 mmol/L 97 103  CO2 20 - 29 mmol/L 21 22  Calcium 8.7 - 10.2 mg/dL 9.7 9.4     Hepatic Function Latest Ref Rng & Units 03/29/2017 02/14/2017  Total Protein 6.0 - 8.5 g/dL 7.1 6.6  Albumin 3.5 - 5.5 g/dL 4.4 4.2  AST 0 - 40 IU/L 23 24  ALT 0 - 44 IU/L 39 54(H)  Alk Phosphatase 39 - 117 IU/L 86 68  Total Bilirubin 0.0 - 1.2 mg/dL 0.6 0.6    CBC Latest Ref Rng & Units 02/14/2017  WBC 3.4 - 10.8 x10E3/uL 6.2  Hemoglobin 13.0 - 17.7 g/dL 14.5  Hematocrit 37.5 - 51.0 % 43.1  Platelets 150 - 379 x10E3/uL 262   Lab Results  Component Value Date   MCV 86 02/14/2017   Lab Results  Component Value Date   TSH 1.880 02/14/2017   No results found for: HGBA1C   BNP No results found for: BNP  ProBNP No results found for: PROBNP   Lipid Panel     Component Value Date/Time   CHOL 168 02/14/2017 0838   TRIG 90 02/14/2017 0838   HDL 66 02/14/2017 0838   CHOLHDL 2.5 02/14/2017 0838   LDLCALC 84 02/14/2017 0838     RADIOLOGY: No results found.   Additional studies/ records that were reviewed today include:  I reviewed the records from [place] including office evaluations as well as laboratory.  ECHO Study  Conclusions: 01/29/17  - Left ventricle: The cavity size was normal. Wall thickness was   increased in a pattern of moderate LVH. Systolic function was   normal. The estimated  ejection fraction was in the range of 60%   to 65%. Wall motion was normal; there were no regional wall   motion abnormalities. There was no evidence of elevated   ventricular filling pressure by Doppler parameters. - Aortic valve: Trileaflet; mildly thickened, mildly calcified   leaflets. - Aorta: Aortic root dimension: 43 mm (ED). - Ascending aorta: The ascending aorta was mildly dilated. - Left atrium: The atrium was mildly dilated. - Right ventricle: The cavity size was mildly dilated. Wall   thickness was normal.  ASSESSMENT:    1. Essential hypertension   2. Medication therapy changed   3. Stage III chronic kidney disease   4. Obesity (BMI 30.0-34.9)   5. Type 2 diabetes mellitus without complication, without long-term current use of insulin ([place])   6. Preoperative clearance      PLAN:  Mr. Vidit Boissonneault is a 51 year old male who has a history of obesity with a BMI of 34.35, at least a 15 year history of hypertension and three-year history of type 2 diabetes mellitus.  Despite medical management, recently his blood pressure has continued to be elevated which led to his initial evaluation with me.  When I initially saw him, he already was on amlodipine 10 mg, lisinopril HCT 40/25 mg, metoprolol 50 mg twice a day and I recommended the addition of spironolactone initially at 12.5 mg daily.  When I saw him in follow-up, he felt improved, but due to slight continued blood pressure elevation.  Further titration of spironolactone to 1.5 mg was recommended.  Laboratory that he had done last week at Dr. Laureen Abrahams office had shown a creatinine that had risen to 1.5 from previously 1.42, although in October 2017.  His creatinine was 1.09.  He is now been off voltaren for 5 days and repeat blood work only 2 days later  showed similar creatinine at 1.5, although BUN had reduced to 23.  At present, he has no signs of edema on exam.  I feel he is having more optimal blood pressure effect from spironolactone then from hydrochlorothiazide.  As result, I am recommending that he discontinue lisinopril HCT and in its place.  He will resume just lisinopril at 40 mg daily but continue the spironolactone at 25 mg daily for his diuretic and aldosterone blockade.  He will be traveling next week with work.  On Friday, I am recommending a follow-up chemistry profile which will be almost 10 days since he has been voltaren and 4 days off hydrochlorothiazide.  At his initial visit, we discussed potential sleep apnea contributing to her refractory hypertension.  At present, he believes he is sleeping well.  He denies gasping for breath.  He believes his sleep is restorative.  He is diabetic and currently is on glyburide 2.5 mg daily and  most recent hemoglobin A1c at 5.7.  He is stable from a cardiac standpoint.  He is not having any anginal symptomatology or shortness of breath.  I'm giving him clearance to undergo outpatient knee surgery.  He will be seeing Dr. Para March later today for follow-up orthopedic assessment.  As long as he is stable, I will see him in 2-3 months for reevaluation..  Medication Adjustments/Labs and Tests Ordered: Current medicines are reviewed at length with the patient today.  Concerns regarding medicines are outlined above.  Medication changes, Labs and Tests ordered today are listed in the Patient Instructions below. Patient Instructions  Your physician has recommended you make the following change in your medication:   1.)  STOP lisinopril-HCT.  This has been replaced with lisinopril 40 mg. continue spironolactone.   Your physician recommends that you return for lab work on Friday.  Keep October appointment.      Signed, Gregory Majestic, MD  04/01/2017 6:19 PM    [facility] [address], [place], [place], [place]  [address] Phone: [phone]

## 2017-04-01 NOTE — Telephone Encounter (Signed)
OV with [name] 8/13

## 2017-04-05 DIAGNOSIS — Z79899 Other long term (current) drug therapy: Secondary | ICD-10-CM | POA: Diagnosis not present

## 2017-04-05 DIAGNOSIS — I10 Essential (primary) hypertension: Secondary | ICD-10-CM | POA: Diagnosis not present

## 2017-04-06 LAB — BASIC METABOLIC PANEL
BUN / CREAT RATIO: 15 (ref 9–20)
BUN: 18 mg/dL (ref 6–24)
CHLORIDE: 105 mmol/L (ref 96–106)
CO2: 21 mmol/L (ref 20–29)
CREATININE: 1.23 mg/dL (ref 0.76–1.27)
Calcium: 9.9 mg/dL (ref 8.7–10.2)
GFR calc Af Amer: 78 mL/min/{1.73_m2} (ref >59)
GFR calc non Af Amer: 68 mL/min/{1.73_m2} (ref >59)
GLUCOSE: 79 mg/dL (ref 65–99)
Potassium: 4.8 mmol/L (ref 3.5–5.2)
Sodium: 143 mmol/L (ref 134–144)

## 2017-04-17 DIAGNOSIS — M1712 Unilateral primary osteoarthritis, left knee: Secondary | ICD-10-CM | POA: Diagnosis not present

## 2017-04-23 DIAGNOSIS — M1712 Unilateral primary osteoarthritis, left knee: Secondary | ICD-10-CM | POA: Diagnosis not present

## 2017-04-23 DIAGNOSIS — R262 Difficulty in walking, not elsewhere classified: Secondary | ICD-10-CM | POA: Diagnosis not present

## 2017-04-23 DIAGNOSIS — Z96652 Presence of left artificial knee joint: Secondary | ICD-10-CM | POA: Diagnosis not present

## 2017-04-23 DIAGNOSIS — M25562 Pain in left knee: Secondary | ICD-10-CM | POA: Diagnosis not present

## 2017-04-23 DIAGNOSIS — M25662 Stiffness of left knee, not elsewhere classified: Secondary | ICD-10-CM | POA: Diagnosis not present

## 2017-04-24 DIAGNOSIS — M25662 Stiffness of left knee, not elsewhere classified: Secondary | ICD-10-CM | POA: Diagnosis not present

## 2017-04-24 DIAGNOSIS — Z96652 Presence of left artificial knee joint: Secondary | ICD-10-CM | POA: Diagnosis not present

## 2017-04-24 DIAGNOSIS — M25562 Pain in left knee: Secondary | ICD-10-CM | POA: Diagnosis not present

## 2017-04-24 DIAGNOSIS — M1712 Unilateral primary osteoarthritis, left knee: Secondary | ICD-10-CM | POA: Diagnosis not present

## 2017-04-24 DIAGNOSIS — R262 Difficulty in walking, not elsewhere classified: Secondary | ICD-10-CM | POA: Diagnosis not present

## 2017-04-26 DIAGNOSIS — R262 Difficulty in walking, not elsewhere classified: Secondary | ICD-10-CM | POA: Diagnosis not present

## 2017-04-26 DIAGNOSIS — M25662 Stiffness of left knee, not elsewhere classified: Secondary | ICD-10-CM | POA: Diagnosis not present

## 2017-04-26 DIAGNOSIS — M25562 Pain in left knee: Secondary | ICD-10-CM | POA: Diagnosis not present

## 2017-04-26 DIAGNOSIS — M1712 Unilateral primary osteoarthritis, left knee: Secondary | ICD-10-CM | POA: Diagnosis not present

## 2017-04-26 DIAGNOSIS — Z96652 Presence of left artificial knee joint: Secondary | ICD-10-CM | POA: Diagnosis not present

## 2017-04-29 DIAGNOSIS — Z96651 Presence of right artificial knee joint: Secondary | ICD-10-CM | POA: Diagnosis not present

## 2017-04-29 DIAGNOSIS — M1712 Unilateral primary osteoarthritis, left knee: Secondary | ICD-10-CM | POA: Diagnosis not present

## 2017-04-29 DIAGNOSIS — M25662 Stiffness of left knee, not elsewhere classified: Secondary | ICD-10-CM | POA: Diagnosis not present

## 2017-04-29 DIAGNOSIS — M25562 Pain in left knee: Secondary | ICD-10-CM | POA: Diagnosis not present

## 2017-04-29 DIAGNOSIS — R262 Difficulty in walking, not elsewhere classified: Secondary | ICD-10-CM | POA: Diagnosis not present

## 2017-05-01 DIAGNOSIS — M1712 Unilateral primary osteoarthritis, left knee: Secondary | ICD-10-CM | POA: Diagnosis not present

## 2017-05-01 DIAGNOSIS — Z96652 Presence of left artificial knee joint: Secondary | ICD-10-CM | POA: Diagnosis not present

## 2017-05-01 DIAGNOSIS — R262 Difficulty in walking, not elsewhere classified: Secondary | ICD-10-CM | POA: Diagnosis not present

## 2017-05-01 DIAGNOSIS — M25562 Pain in left knee: Secondary | ICD-10-CM | POA: Diagnosis not present

## 2017-05-01 DIAGNOSIS — M25662 Stiffness of left knee, not elsewhere classified: Secondary | ICD-10-CM | POA: Diagnosis not present

## 2017-05-02 DIAGNOSIS — M25562 Pain in left knee: Secondary | ICD-10-CM | POA: Diagnosis not present

## 2017-05-06 DIAGNOSIS — Z96652 Presence of left artificial knee joint: Secondary | ICD-10-CM | POA: Diagnosis not present

## 2017-05-06 DIAGNOSIS — M25562 Pain in left knee: Secondary | ICD-10-CM | POA: Diagnosis not present

## 2017-05-06 DIAGNOSIS — M25662 Stiffness of left knee, not elsewhere classified: Secondary | ICD-10-CM | POA: Diagnosis not present

## 2017-05-06 DIAGNOSIS — M1712 Unilateral primary osteoarthritis, left knee: Secondary | ICD-10-CM | POA: Diagnosis not present

## 2017-05-06 DIAGNOSIS — R262 Difficulty in walking, not elsewhere classified: Secondary | ICD-10-CM | POA: Diagnosis not present

## 2017-05-07 ENCOUNTER — Encounter: Payer: Self-pay | Admitting: Cardiovascular Disease

## 2017-05-07 MED ORDER — SPIRONOLACTONE 25 MG PO TABS: 25.0000 mg | ORAL_TABLET | Freq: Once | ORAL | 3 refills | Status: DC

## 2017-05-08 DIAGNOSIS — M1712 Unilateral primary osteoarthritis, left knee: Secondary | ICD-10-CM | POA: Diagnosis not present

## 2017-05-08 DIAGNOSIS — R262 Difficulty in walking, not elsewhere classified: Secondary | ICD-10-CM | POA: Diagnosis not present

## 2017-05-08 DIAGNOSIS — Z96652 Presence of left artificial knee joint: Secondary | ICD-10-CM | POA: Diagnosis not present

## 2017-05-08 DIAGNOSIS — M25662 Stiffness of left knee, not elsewhere classified: Secondary | ICD-10-CM | POA: Diagnosis not present

## 2017-05-08 DIAGNOSIS — M25562 Pain in left knee: Secondary | ICD-10-CM | POA: Diagnosis not present

## 2017-05-10 DIAGNOSIS — M25562 Pain in left knee: Secondary | ICD-10-CM | POA: Diagnosis not present

## 2017-05-10 DIAGNOSIS — Z96652 Presence of left artificial knee joint: Secondary | ICD-10-CM | POA: Diagnosis not present

## 2017-05-10 DIAGNOSIS — R262 Difficulty in walking, not elsewhere classified: Secondary | ICD-10-CM | POA: Diagnosis not present

## 2017-05-10 DIAGNOSIS — M1712 Unilateral primary osteoarthritis, left knee: Secondary | ICD-10-CM | POA: Diagnosis not present

## 2017-05-10 DIAGNOSIS — M25662 Stiffness of left knee, not elsewhere classified: Secondary | ICD-10-CM | POA: Diagnosis not present

## 2017-05-13 DIAGNOSIS — M1712 Unilateral primary osteoarthritis, left knee: Secondary | ICD-10-CM | POA: Diagnosis not present

## 2017-05-13 DIAGNOSIS — M25662 Stiffness of left knee, not elsewhere classified: Secondary | ICD-10-CM | POA: Diagnosis not present

## 2017-05-13 DIAGNOSIS — Z96652 Presence of left artificial knee joint: Secondary | ICD-10-CM | POA: Diagnosis not present

## 2017-05-13 DIAGNOSIS — R262 Difficulty in walking, not elsewhere classified: Secondary | ICD-10-CM | POA: Diagnosis not present

## 2017-05-13 DIAGNOSIS — M25562 Pain in left knee: Secondary | ICD-10-CM | POA: Diagnosis not present

## 2017-05-15 DIAGNOSIS — M1712 Unilateral primary osteoarthritis, left knee: Secondary | ICD-10-CM | POA: Diagnosis not present

## 2017-05-15 DIAGNOSIS — M25562 Pain in left knee: Secondary | ICD-10-CM | POA: Diagnosis not present

## 2017-05-15 DIAGNOSIS — R262 Difficulty in walking, not elsewhere classified: Secondary | ICD-10-CM | POA: Diagnosis not present

## 2017-05-15 DIAGNOSIS — Z96652 Presence of left artificial knee joint: Secondary | ICD-10-CM | POA: Diagnosis not present

## 2017-05-15 DIAGNOSIS — M25662 Stiffness of left knee, not elsewhere classified: Secondary | ICD-10-CM | POA: Diagnosis not present

## 2017-05-17 DIAGNOSIS — M1712 Unilateral primary osteoarthritis, left knee: Secondary | ICD-10-CM | POA: Diagnosis not present

## 2017-05-17 DIAGNOSIS — M25562 Pain in left knee: Secondary | ICD-10-CM | POA: Diagnosis not present

## 2017-05-17 DIAGNOSIS — R262 Difficulty in walking, not elsewhere classified: Secondary | ICD-10-CM | POA: Diagnosis not present

## 2017-05-17 DIAGNOSIS — Z96652 Presence of left artificial knee joint: Secondary | ICD-10-CM | POA: Diagnosis not present

## 2017-05-17 DIAGNOSIS — M25662 Stiffness of left knee, not elsewhere classified: Secondary | ICD-10-CM | POA: Diagnosis not present

## 2017-05-20 DIAGNOSIS — M1712 Unilateral primary osteoarthritis, left knee: Secondary | ICD-10-CM | POA: Diagnosis not present

## 2017-05-20 DIAGNOSIS — Z96652 Presence of left artificial knee joint: Secondary | ICD-10-CM | POA: Diagnosis not present

## 2017-05-20 DIAGNOSIS — M25562 Pain in left knee: Secondary | ICD-10-CM | POA: Diagnosis not present

## 2017-05-20 DIAGNOSIS — M25662 Stiffness of left knee, not elsewhere classified: Secondary | ICD-10-CM | POA: Diagnosis not present

## 2017-05-20 DIAGNOSIS — R262 Difficulty in walking, not elsewhere classified: Secondary | ICD-10-CM | POA: Diagnosis not present

## 2017-05-22 DIAGNOSIS — M25662 Stiffness of left knee, not elsewhere classified: Secondary | ICD-10-CM | POA: Diagnosis not present

## 2017-05-22 DIAGNOSIS — M1712 Unilateral primary osteoarthritis, left knee: Secondary | ICD-10-CM | POA: Diagnosis not present

## 2017-05-22 DIAGNOSIS — M25562 Pain in left knee: Secondary | ICD-10-CM | POA: Diagnosis not present

## 2017-05-22 DIAGNOSIS — Z96652 Presence of left artificial knee joint: Secondary | ICD-10-CM | POA: Diagnosis not present

## 2017-05-22 DIAGNOSIS — R262 Difficulty in walking, not elsewhere classified: Secondary | ICD-10-CM | POA: Diagnosis not present

## 2017-05-23 DIAGNOSIS — M25562 Pain in left knee: Secondary | ICD-10-CM | POA: Diagnosis not present

## 2017-05-24 DIAGNOSIS — M25562 Pain in left knee: Secondary | ICD-10-CM | POA: Diagnosis not present

## 2017-05-24 DIAGNOSIS — R262 Difficulty in walking, not elsewhere classified: Secondary | ICD-10-CM | POA: Diagnosis not present

## 2017-05-24 DIAGNOSIS — M1712 Unilateral primary osteoarthritis, left knee: Secondary | ICD-10-CM | POA: Diagnosis not present

## 2017-05-24 DIAGNOSIS — M25662 Stiffness of left knee, not elsewhere classified: Secondary | ICD-10-CM | POA: Diagnosis not present

## 2017-05-24 DIAGNOSIS — Z96652 Presence of left artificial knee joint: Secondary | ICD-10-CM | POA: Diagnosis not present

## 2017-05-28 ENCOUNTER — Encounter: Payer: Self-pay | Admitting: Cardiovascular Disease

## 2017-05-28 ENCOUNTER — Ambulatory Visit (INDEPENDENT_AMBULATORY_CARE_PROVIDER_SITE_OTHER): Payer: BLUE CROSS/BLUE SHIELD | Admitting: Cardiovascular Disease

## 2017-05-28 VITALS — BP 146/90 | HR 72 | Ht 74.0 in | Wt 272.4 lb

## 2017-05-28 DIAGNOSIS — E669 Obesity, unspecified: Secondary | ICD-10-CM

## 2017-05-28 DIAGNOSIS — Z79899 Other long term (current) drug therapy: Secondary | ICD-10-CM | POA: Diagnosis not present

## 2017-05-28 DIAGNOSIS — N183 Chronic kidney disease, stage 3 unspecified: Secondary | ICD-10-CM

## 2017-05-28 DIAGNOSIS — E119 Type 2 diabetes mellitus without complications: Secondary | ICD-10-CM

## 2017-05-28 DIAGNOSIS — I10 Essential (primary) hypertension: Secondary | ICD-10-CM

## 2017-05-28 MED ORDER — HYDROCHLOROTHIAZIDE 12.5 MG PO CAPS: 12.5000 mg | ORAL_CAPSULE | ORAL | 3 refills | Status: DC

## 2017-05-28 NOTE — Progress Notes (Signed)
Cardiology Office Note    Date:  05/28/2017   ID:  Gregory, Khan 05-04-1966, MRN [id number]  PCP:  Billy Fischer, MD  Cardiologist:  [name], MD   Follow-up office visit   History of Present Illness:  Gregory Khan is a 51 y.o. 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  He was initially referred by Dr. Leola Brazil at [facility] for cardiology consultation and further evaluation of hypertension.  I last saw for preoperative evaluation prior to knee surgery by Dr. Noemi Chapel.  He presents now for follow-up evaluation.  [name] admits to a 15 year history of hypertension as well as a three-year history of type 2 diabetes mellitus.  He also has a history of hyperlipidemia.  He has been followed by Dr. Melanee Spry and recently Dr. Leola Brazil at [facility].  Remotely, he has been on Benicar HCT, amlodipine and metoprolol for hypertension.  Recently, his blood pressures have consistently been elevated and Benicar was discontinued and he is now on lisinopril/HCT 40/25 mg daily in addition to metoprolol 50 mg twice a day and amlodipine 10 mg daily.  Review of the records from [facility] indicate that his blood pressures have been in the 140s to 183F with diastolics in the low 58I.  Laboratory in 2017 had shown a glucose of 144.  BUN 20, Cr 1.09.  Normal LFTs.  He has been on atorvastatin 20 mg and cholesterol was 161, triglycerides 77, HDL 62, LDL 84.  TSH was normal at 1.4.  Hemoglobin 15 and hematocrit 45.1.  Hemoglobin A1c in January 2018 at improved to 5.7.  Chest x-ray suggested my mild cardiac enlargement without mediastinal widening and was without active cardiac pulmonary disease.  Earlier this year, there was a blood pressure reading in January of 162/105.    When I initially saw him, his blood pressure remained elevated.  With his long-standing hypertension I added  spironolactone for aldosterone blockade initially at 12.5 mg.   He has felt improved on this.  I also  scheduled him for an echo Doppler study which was done on 01/29/2017.  This showed moderate LVH.  He had normal systolic function.  Diastolic parameters were normal.  There was mild aortic root dilation.  His left atrium was mildly dilated.  He underwent subsequent laboratory.  Hemoglobin and hematocrit were stable.  He has mild renal insufficiency.  His most recent creatinine was 1.42.  ALT was minimally increased at 54 with a normal AST at 24.  He has felt improved.  When I last saw him, he had just returned from [place] and worked both in [place] in [place].  At that office visit, I recommended further titration of spironolactone to 25 mg daily since his blood pressure was improved, but remained elevated.  Recently, he had also been taking Voltaren.  He underwent follow-up labwork last week, which now showed a BUN that had risen by his report and 37 and creatinine at 1.50.  He was called by Dr. Laureen Abrahams office.  Voltaren was discontinued.  Repeat chemistry on August 10 showed a BUN had improved to 23 and his creatinine remained at 1.5.  LFTs were normal.  He feels the spironolactone has controlled his blood pressure.  He could sense greater diuretic effect.  He denied any chest pain or palpitations.  When I last saw him, he was given clearance to undergo knee surgery.  He did have transient increasing creatinine which improved with discontinuance  of diclofenac.  He also was switched to lisinopril alone instead of combination with HCTZ.  He underwent knee surgery at the outpatient surgical Center on 04/17/2017 without cardiovascular compromise.  He has been undergoing physical therapy.  Initially he had lost some weight but for the past several weeks he has been eating more and he has returned to work with increased stress.  Initially, his blood pressure was excellent, but over the past 2 weeks he has noticed his blood pressure again slightly increasing.  He is unaware of palpitations.  He presents for  reevaluation.  Past Medical History:  Diagnosis Date  . Diabetes mellitus without complication ([place])   . Hyperlipidemia   . Hypertension   . Obesity     Past Surgical History:  Procedure Laterality Date  . WISDOM TOOTH EXTRACTION      Current Medications: Outpatient Medications Prior to Visit  Medication Sig Dispense Refill  . amLODipine (NORVASC) 10 MG tablet Take 10 mg by mouth daily.    Marland Kitchen atorvastatin (LIPITOR) 20 MG tablet Take 20 mg by mouth daily.    Marland Kitchen glyBURIDE (DIABETA) 2.5 MG tablet Take 2.5 mg by mouth 2 (two) times daily with a meal.    . lisinopril (PRINIVIL,ZESTRIL) 40 MG tablet Take 1 tablet (40 mg total) by mouth daily. 90 tablet 3  . metoprolol (LOPRESSOR) 50 MG tablet Take 50 mg by mouth 2 (two) times daily.     Marland Kitchen spironolactone (ALDACTONE) 25 MG tablet Take 1 tablet (25 mg total) by mouth once. 90 tablet 3   No facility-administered medications prior to visit.      Allergies:   Patient has no known allergies.   Social History   Social History  . Marital status: Married    Spouse name: N/A  . Number of children: N/A  . Years of education: N/A   Social History Main Topics  . Smoking status: Never Smoker  . Smokeless tobacco: Never Used  . Alcohol use 0.0 oz/week     Comment: socially  . Drug use: No  . Sexual activity: Not Asked   Other Topics Concern  . None   Social History Narrative  . 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  He does travel with work.  He is married for 24 years.  He has 2 children.  His undergraduate college was at [employer].  He does drink occasional wine and rum.  His exercise has been limited since his knee replacement, but previously had biked.  Family History:  The patient's family history includes Hypertension in his father.  His mother is currently living at age 16.  His father died at 31 but was a smoker and had previously  undergone CABG revascularization surgery.  He has 2 brothers, ages 67 and 11 who are alive and well.  He has 2 children, ages 65 and 15.  ROS General: Negative; No fevers, chills, or night sweats;  HEENT: Negative; No changes in vision or hearing, sinus congestion, difficulty swallowing Pulmonary: Negative; No cough, wheezing, shortness of breath, hemoptysis Cardiovascular: See history of present illness GI: Negative; No nausea, vomiting, diarrhea, or abdominal pain GU: Negative; No dysuria, hematuria, or difficulty voiding Musculoskeletal: Status post right knee replacement in January 2018. Status post recent left knee surgery 04/17/2017 Hematologic/Oncology: Negative; no easy bruising, bleeding Endocrine: Negative; no heat/cold intolerance; no diabetes Neuro: Negative; no changes in balance, headaches  Skin: Negative; No rashes or skin lesions Psychiatric: Negative; No behavioral problems, depression Sleep: Negative; No snoring, daytime sleepiness, hypersomnolence, bruxism, restless legs, hypnogognic hallucinations, no cataplexy Other comprehensive 14 point system review is negative.   PHYSICAL EXAM:   VS:  BP (!) 146/90   Pulse 72   Ht 6' 2"  (1.88 m)   Wt 272 lb 6.4 oz (123.6 kg)   BMI 34.97 kg/m     Repeat blood pressure by me 132/90  Wt Readings from Last 3 Encounters:  05/28/17 272 lb 6.4 oz (123.6 kg)  04/01/17 271 lb 3.2 oz (123 kg)  02/25/17 271 lb (122.9 kg)    General: Alert, oriented, no distress.  Skin: normal turgor, no rashes, warm and dry HEENT: Normocephalic, atraumatic. Pupils equal round and reactive to light; sclera anicteric; extraocular muscles intact;  Nose without nasal septal hypertrophy Mouth/Parynx benign; Mallinpatti scale 3 Neck: No JVD, no carotid bruits; normal carotid upstroke Lungs: clear to ausculatation and percussion; no wheezing or rales Chest wall: without tenderness to palpitation Heart: PMI not displaced, RRR, s1 s2 normal, 1/6 systolic  murmur, no diastolic murmur, no rubs, gallops, thrills, or heaves Abdomen: soft, nontender; no hepatosplenomehaly, BS+; abdominal aorta nontender and not dilated by palpation. Back: no CVA tenderness Pulses 2+ Musculoskeletal: full range of motion, normal strength, no joint deformities Extremities: no clubbing cyanosis or edema, Homan's sign negative  Neurologic: grossly nonfocal; Cranial nerves grossly wnl Psychologic: Normal mood and affect   Studies/Labs Reviewed:   EKG:  EKG is ordered today.  Normal sinus rhythm at 72 bpm.  No ectopy.  Normal intervals.  Q wave in 3.  August 2018 ECG (independently read by me): Normal sinus rhythm at 67 bpm.  Borderline first-degree AV block with a PR interval of 206 ms.  TC interval normal at 388 ms.  No ST segment changes.  Small Q wave in lead 3, nondiagnostic  July 2018 ECG (independently read by me): Normal sinus rhythm at 74 bpm.  Normal intervals.  No ST segment changes.  No ectopy.  May 2018 ECG (independently read by me): Normal sinus rhythm at 66 bpm.  Borderline first degree AV block with a PR interval at 204 ms.  No ST segment changes.  Nondiagnostic Q wave in lead 3.  Recent Labs: BMP Latest Ref Rng & Units 04/05/2017 03/29/2017 02/14/2017  Glucose 65 - 99 mg/dL 79 168(H) 125(H)  BUN 6 - 24 mg/dL 18 23 32(H)  Creatinine 0.76 - 1.27 mg/dL 1.23 1.50(H) 1.42(H)  BUN/Creat Ratio 9 - 20 15 15  23(H)  Sodium 134 - 144 mmol/L 143 136 139  Potassium 3.5 - 5.2 mmol/L 4.8 4.6 4.9  Chloride 96 - 106 mmol/L 105 97 103  CO2 20 - 29 mmol/L 21 21 22   Calcium 8.7 - 10.2 mg/dL 9.9 9.7 9.4     Hepatic Function Latest Ref Rng & Units 03/29/2017 02/14/2017  Total Protein 6.0 - 8.5 g/dL 7.1 6.6  Albumin 3.5 - 5.5 g/dL 4.4 4.2  AST 0 - 40 IU/L 23 24  ALT 0 - 44 IU/L 39 54(H)  Alk Phosphatase 39 - 117 IU/L 86 68  Total Bilirubin 0.0 - 1.2 mg/dL 0.6 0.6    CBC Latest Ref Rng & Units 02/14/2017  WBC 3.4 - 10.8 x10E3/uL 6.2  Hemoglobin 13.0 - 17.7 g/dL  14.5  Hematocrit 37.5 - 51.0 % 43.1  Platelets 150 - 379 x10E3/uL 262   Lab Results  Component Value Date   MCV 86 02/14/2017  Lab Results  Component Value Date   TSH 1.880 02/14/2017   No results found for: HGBA1C   BNP No results found for: BNP  ProBNP No results found for: PROBNP   Lipid Panel     Component Value Date/Time   CHOL 168 02/14/2017 0838   TRIG 90 02/14/2017 0838   HDL 66 02/14/2017 0838   CHOLHDL 2.5 02/14/2017 0838   LDLCALC 84 02/14/2017 0838     RADIOLOGY: No results found.   Additional studies/ records that were reviewed today include:  I reviewed the records from [facility] including office evaluations as well as laboratory.  ECHO Study Conclusions: 01/29/17  - Left ventricle: The cavity size was normal. Wall thickness was   increased in a pattern of moderate LVH. Systolic function was   normal. The estimated ejection fraction was in the range of 60%   to 65%. Wall motion was normal; there were no regional wall   motion abnormalities. There was no evidence of elevated   ventricular filling pressure by Doppler parameters. - Aortic valve: Trileaflet; mildly thickened, mildly calcified   leaflets. - Aorta: Aortic root dimension: 43 mm (ED). - Ascending aorta: The ascending aorta was mildly dilated. - Left atrium: The atrium was mildly dilated. - Right ventricle: The cavity size was mildly dilated. Wall   thickness was normal.  ASSESSMENT:    1. Medication management   2. Essential hypertension   3. Stage III chronic kidney disease (HCC)   4. Obesity (BMI 30.0-34.9)   5. Type 2 diabetes mellitus without complication, without long-term current use of insulin [facility])      PLAN:  Mr. Karl Erway is a 51 year old male who has a history of obesity with a BMI of 34.97 at least a 15 year history of hypertension and three-year history of type 2 diabetes mellitus.  Despite medical management, his blood pressure continued to be elevated which  led to his initial evaluation with me on 01/15/2017.  When I initially saw him, he already was on amlodipine 10 mg, lisinopril HCT 40/25 mg, metoprolol 50 mg twice a day and I recommended the addition of spironolactone initially at 12.5 mg daily.  When I saw him in follow-up, he felt improved, but due to slight continued blood pressure elevation .  I further titrated spironolactone to 25  Mg. prior to his knee surgery.  He was taken diclofenac and was on the combination lisinopril HCT.  Laboratory at Dr. Jarrett Ables office had shown an increasing creatinine to 1.5.  With discontinuance of diclofenac and changing him to just lisinopril alone renal function improved.  Subsequent creatinine was 1.23.  He tolerated his knee surgery without cardiac events.  Initially, his blood pressure at home.  Continue to stay stable, but since his return to work 2 weeks ago.  He's noticed a trend in that his blood pressure has begun to increase again.  He admits to eating more and has gained at least 5 pounds over this period.  I have recommended resumption of HCTZ 12.5 mg but he will take this every other day.  In 2 weeks.  Repeat BMP will be obtained to make certain renal function continues to be stable.  He will continue to take amlodipine at 10 mg, lisinopril at 40 mg, metoprolol 50 twice a day in addition to spironolactone 25 mg daily.  Once physical therapy is completed, he will hopefully be able to exercise more than he had previously due to his knee difficulty.  Ultimately, I have  recommended he walk at least 5 days per week for 30 minutes at a time if at all possible.  I will contact him regarding his laboratory.  I will see him in 6 months for reevaluation.   Medication Adjustments/Labs and Tests Ordered: Current medicines are reviewed at length with the patient today.  Concerns regarding medicines are outlined above.  Medication changes, Labs and Tests ordered today are listed in the Patient Instructions below. Patient  Instructions  Medication Instructions:  START HCTZ 12.5 mg (1 capsule) every other day --if blood pressure continues to run >331 systolic or > 90 diastolic increase to daily.  Labwork: Please return for labs in 2 WEEKS (BMET)  Our in office lab hours are Monday-Friday 8:00-4:30, closed for lunch 1-2 pm.  No appointment needed.  Follow-Up: Your physician wants you to follow-up in: 6 MONTHS with [name].  You will receive a reminder letter in the mail two months in advance. If you don't receive a letter, please call our office to schedule the follow-up appointment.   Any Other Special Instructions Will Be Listed Below (If Applicable).     If you need a refill on your cardiac medications before your next appointment, please call your pharmacy.      Signed, [name], MD  05/28/2017 7:14 PM    [facility] [address] [place], [place], [place]  [address] Phone: [phone]

## 2017-05-28 NOTE — Patient Instructions (Addendum)
Medication Instructions:  START HCTZ 12.5 mg (1 capsule) every other day --if blood pressure continues to run >619 systolic or > 90 diastolic increase to daily.  Labwork: Please return for labs in 2 WEEKS (BMET)  Our in office lab hours are Monday-Friday 8:00-4:30, closed for lunch 1-2 pm.  No appointment needed.  Follow-Up: Your physician wants you to follow-up in: 6 MONTHS with [name].  You will receive a reminder letter in the mail two months in advance. If you don't receive a letter, please call our office to schedule the follow-up appointment.   Any Other Special Instructions Will Be Listed Below (If Applicable).     If you need a refill on your cardiac medications before your next appointment, please call your pharmacy.

## 2017-05-31 DIAGNOSIS — M25662 Stiffness of left knee, not elsewhere classified: Secondary | ICD-10-CM | POA: Diagnosis not present

## 2017-05-31 DIAGNOSIS — M25562 Pain in left knee: Secondary | ICD-10-CM | POA: Diagnosis not present

## 2017-05-31 DIAGNOSIS — Z96652 Presence of left artificial knee joint: Secondary | ICD-10-CM | POA: Diagnosis not present

## 2017-05-31 DIAGNOSIS — M1712 Unilateral primary osteoarthritis, left knee: Secondary | ICD-10-CM | POA: Diagnosis not present

## 2017-05-31 DIAGNOSIS — R262 Difficulty in walking, not elsewhere classified: Secondary | ICD-10-CM | POA: Diagnosis not present

## 2017-06-14 DIAGNOSIS — Z79899 Other long term (current) drug therapy: Secondary | ICD-10-CM | POA: Diagnosis not present

## 2017-06-14 LAB — BASIC METABOLIC PANEL
BUN/Creatinine Ratio: 16 (ref 9–20)
BUN: 18 mg/dL (ref 6–24)
CALCIUM: 9.8 mg/dL (ref 8.7–10.2)
CO2: 22 mmol/L (ref 20–29)
Chloride: 102 mmol/L (ref 96–106)
Creatinine, Ser: 1.15 mg/dL (ref 0.76–1.27)
GFR calc Af Amer: 84 mL/min/{1.73_m2} (ref >59)
GFR, EST NON AFRICAN AMERICAN: 73 mL/min/{1.73_m2} (ref >59)
GLUCOSE: 111 mg/dL — AB (ref 65–99)
POTASSIUM: 4.7 mmol/L (ref 3.5–5.2)
Sodium: 139 mmol/L (ref 134–144)

## 2017-06-18 ENCOUNTER — Encounter: Payer: Self-pay | Admitting: Cardiovascular Disease

## 2017-06-24 DIAGNOSIS — Z96652 Presence of left artificial knee joint: Secondary | ICD-10-CM | POA: Diagnosis not present

## 2017-09-13 ENCOUNTER — Encounter: Payer: Self-pay | Admitting: Cardiovascular Disease

## 2017-09-13 MED ORDER — ATORVASTATIN CALCIUM 20 MG PO TABS: 20.0000 mg | ORAL_TABLET | Freq: Every day | ORAL | 1 refills | Status: DC

## 2017-09-13 MED ORDER — GLYBURIDE 2.5 MG PO TABS: 2.5000 mg | ORAL_TABLET | Freq: Two times a day (BID) | ORAL | 0 refills | Status: AC

## 2017-09-13 MED ORDER — AMLODIPINE BESYLATE 10 MG PO TABS: 10.0000 mg | ORAL_TABLET | Freq: Every day | ORAL | 1 refills | Status: DC

## 2017-09-13 MED ORDER — METOPROLOL TARTRATE 50 MG PO TABS: 50.0000 mg | ORAL_TABLET | Freq: Two times a day (BID) | ORAL | 1 refills | Status: DC

## 2017-10-22 ENCOUNTER — Telehealth: Payer: Self-pay | Admitting: Cardiovascular Disease

## 2017-10-22 NOTE — Telephone Encounter (Signed)
New Message     Patient would like a call back with information on sleep apnea and the process

## 2017-10-22 NOTE — Telephone Encounter (Signed)
Returned a call to patient. He had questions about the process of getting a sleep study. Informed the patient he will first need appointment with MD to document symptoms to see if a sleep study is warranted. Once this has been done the doctor will order a sleep study. Patient says he has a appointment with Dr Claiborne Billings in May. He will address it at that time.

## 2017-11-04 DIAGNOSIS — M7581 Other shoulder lesions, right shoulder: Secondary | ICD-10-CM | POA: Diagnosis not present

## 2017-11-04 DIAGNOSIS — Z96652 Presence of left artificial knee joint: Secondary | ICD-10-CM | POA: Diagnosis not present

## 2017-11-04 DIAGNOSIS — S63501A Unspecified sprain of right wrist, initial encounter: Secondary | ICD-10-CM | POA: Diagnosis not present

## 2017-12-03 DIAGNOSIS — S63501A Unspecified sprain of right wrist, initial encounter: Secondary | ICD-10-CM | POA: Diagnosis not present

## 2017-12-03 DIAGNOSIS — M7581 Other shoulder lesions, right shoulder: Secondary | ICD-10-CM | POA: Diagnosis not present

## 2017-12-03 DIAGNOSIS — Z96652 Presence of left artificial knee joint: Secondary | ICD-10-CM | POA: Diagnosis not present

## 2017-12-20 ENCOUNTER — Encounter: Payer: Self-pay | Admitting: Cardiovascular Disease

## 2017-12-20 ENCOUNTER — Ambulatory Visit: Payer: BLUE CROSS/BLUE SHIELD | Admitting: Cardiovascular Disease

## 2017-12-20 VITALS — BP 120/84 | HR 60 | Ht 74.0 in | Wt 277.0 lb

## 2017-12-20 DIAGNOSIS — R0683 Snoring: Secondary | ICD-10-CM | POA: Diagnosis not present

## 2017-12-20 DIAGNOSIS — Z6835 Body mass index (BMI) 35.0-35.9, adult: Secondary | ICD-10-CM

## 2017-12-20 DIAGNOSIS — E119 Type 2 diabetes mellitus without complications: Secondary | ICD-10-CM

## 2017-12-20 DIAGNOSIS — N183 Chronic kidney disease, stage 3 unspecified: Secondary | ICD-10-CM

## 2017-12-20 DIAGNOSIS — R351 Nocturia: Secondary | ICD-10-CM | POA: Diagnosis not present

## 2017-12-20 DIAGNOSIS — G478 Other sleep disorders: Secondary | ICD-10-CM

## 2017-12-20 DIAGNOSIS — I10 Essential (primary) hypertension: Secondary | ICD-10-CM

## 2017-12-20 NOTE — Patient Instructions (Signed)
Medication Instructions:  Your physician recommends that you continue on your current medications as directed. Please refer to the Current Medication list given to you today.  Testing/Procedures: Your physician has recommended that you have a sleep study. This test records several body functions during sleep, including: brain activity, eye movement, oxygen and carbon dioxide blood levels, heart rate and rhythm, breathing rate and rhythm, the flow of air through your mouth and nose, snoring, body muscle movements, and chest and belly movement.  Follow-Up: Your physician wants you to follow-up in: 6 months with [name] (unless needed sooner pending sleep study results). You will receive a reminder letter in the mail two months in advance. If you don't receive a letter, please call our office to schedule the follow-up appointment.      If you need a refill on your cardiac medications before your next appointment, please call your pharmacy.

## 2017-12-20 NOTE — Progress Notes (Signed)
Cardiology Office Note    Date:  12/22/2017   ID:  WILBON OBENCHAIN, DOB [birth year], MRN [id number]  PCP:  [name], MD  Cardiologist:  [name], MD   Follow-up office visit   History of Present Illness:  Gregory Khan is a 52 y.o. 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  He was initially referred by Dr. Leola Brazil at [facility] for cardiology consultation and further evaluation of hypertension.  I  saw for preoperative evaluation prior to knee surgery by Dr. Noemi Chapel.  I last saw him in October 2018.  He presents for follow-up evaluation..  Mr. Ringer admits to a 15 year history of hypertension as well as a three-year history of type 2 diabetes mellitus.  He also has a history of hyperlipidemia.  He has been followed by Dr. Melanee Spry and recently Dr. Leola Brazil at [facility].  Remotely, he has been on Benicar HCT, amlodipine and metoprolol for hypertension.  Recently, his blood pressures have consistently been elevated and Benicar was discontinued and he is now on lisinopril/HCT 40/25 mg daily in addition to metoprolol 50 mg twice a day and amlodipine 10 mg daily.  Review of the records from [place] indicate that his blood pressures have been in the 140s to 923R with diastolics in the low 00T.  Laboratory in 2017 had shown a glucose of 144.  BUN 20, Cr 1.09.  Normal LFTs.  He has been on atorvastatin 20 mg and cholesterol was 161, triglycerides 77, HDL 62, LDL 84.  TSH was normal at 1.4.  Hemoglobin 15 and hematocrit 45.1.  Hemoglobin A1c in January 2018 at improved to 5.7.  Chest x-ray suggested my mild cardiac enlargement without mediastinal widening and was without active cardiac pulmonary disease.  Earlier this year, there was a blood pressure reading in January of 162/105.    When I initially saw him, his blood pressure remained elevated.  With his long-standing hypertension I added  spironolactone for aldosterone blockade initially at 12.5 mg.   He has felt  improved on this.  I also scheduled him for an echo Doppler study which was done on 01/29/2017.  This showed moderate LVH.  He had normal systolic function.  Diastolic parameters were normal.  There was mild aortic root dilation.  His left atrium was mildly dilated.  He underwent subsequent laboratory.  Hemoglobin and hematocrit were stable.  He has mild renal insufficiency.  His most recent creatinine was 1.42.  ALT was minimally increased at 54 with a normal AST at 24.  He has felt improved.  When I last saw him, he had just returned from [place] and worked both in [place] in [place].  At that office visit, I recommended further titration of spironolactone to 25 mg daily since his blood pressure was improved, but remained elevated.  Recently, he had also been taking Voltaren.  He underwent follow-up labwork last week, which now showed a BUN that had risen by his report and 37 and creatinine at 1.50.  He was called by Dr. Laureen Abrahams office.  Voltaren was discontinued.  Repeat chemistry on August 10 showed a BUN had improved to 23 and his creatinine remained at 1.5.  LFTs were normal.  He feels the spironolactone has controlled his blood pressure.  He could sense greater diuretic effect.  He denied any chest pain or palpitations.  He was given clearance to undergo knee surgery.  He did have transient increasing creatinine which improved with  discontinuance of diclofenac.  He also was switched to lisinopril alone instead of combination with HCTZ.  He underwent knee surgery at the outpatient surgical Center on 04/17/2017 without cardiovascular compromise.  When I last saw him, pressure was trending upward upon his return to work from his surgery.  Is eating more and had gained over 5 pounds.  I recommended resumption of HCTZ 12.5 mg every other day.   Last saw him, he is doing well and he denies any knee discomfort and as a result has been able to be more active.  He has been trying to lose weight.  He denies  chest pain or shortness of breath.  H his wife says that he snores with moderate intensity.  He goes to bed at 10:30 PM and wakes up at 6:30 AM.  Often he has nocturia 3 times per night.  Today, I calculated an Epworth Sleepiness Scale score which endorsed at 8.  His blood pressure has been improved.  He presents for reevaluation.    Past Medical History:  Diagnosis Date  . Diabetes mellitus without complication ([place])   . Hyperlipidemia   . Hypertension   . Obesity     Past Surgical History:  Procedure Laterality Date  . WISDOM TOOTH EXTRACTION      Current Medications: Outpatient Medications Prior to Visit  Medication Sig Dispense Refill  . amLODipine (NORVASC) 10 MG tablet Take 1 tablet (10 mg total) by mouth daily. 90 tablet 1  . atorvastatin (LIPITOR) 20 MG tablet Take 1 tablet (20 mg total) by mouth daily. 90 tablet 1  . glyBURIDE (DIABETA) 2.5 MG tablet Take 1 tablet (2.5 mg total) by mouth 2 (two) times daily with a meal. 60 tablet 0  . hydrochlorothiazide (MICROZIDE) 12.5 MG capsule Take 12.5 mg by mouth every other day.    . metoprolol tartrate (LOPRESSOR) 50 MG tablet Take 1 tablet (50 mg total) by mouth 2 (two) times daily. 90 tablet 1  . spironolactone (ALDACTONE) 25 MG tablet Take 25 mg by mouth daily.    Marland Kitchen lisinopril (PRINIVIL,ZESTRIL) 40 MG tablet Take 1 tablet (40 mg total) by mouth daily. 90 tablet 3  . hydrochlorothiazide (MICROZIDE) 12.5 MG capsule Take 1 capsule (12.5 mg total) by mouth every other day. 45 capsule 3   No facility-administered medications prior to visit.      Allergies:   Patient has no known allergies.   Social History   Socioeconomic History  . Marital status: Married    Spouse name: Not on file  . Number of children: Not on file  . Years of education: Not on file  . Highest education level: Not on file  Occupational History  . Not on file  Social Needs  . Financial resource strain: Not on file  . Food insecurity:    Worry: Not on  file    Inability: Not on file  . Transportation needs:    Medical: Not on file    Non-medical: Not on file  Tobacco Use  . Smoking status: Never Smoker  . Smokeless tobacco: Never Used  Substance and Sexual Activity  . Alcohol use: Yes    Alcohol/week: 0.0 oz    Comment: socially  . Drug use: No  . Sexual activity: Not on file  Lifestyle  . Physical activity:    Days per week: Not on file    Minutes per session: Not on file  . Stress: Not on file  Relationships  . Social connections:  Talks on phone: Not on file    Gets together: Not on file    Attends religious service: Not on file    Active member of club or organization: Not on file    Attends meetings of clubs or organizations: Not on file    Relationship status: Not on file  Other Topics Concern  . Not on file  Social History Narrative  . 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  He does travel with work.  He is married for 24 years.  He has 2 children.  His undergraduate college was at [employer].  He does drink occasional wine and rum.  His exercise has been limited since his knee replacement, but previously had biked.  Family History:  The patient's family history includes Hypertension in his father.  His mother is currently living at age 49.  His father died at 14 but was a smoker and had previously undergone CABG revascularization surgery.  He has 2 brothers, ages 52 and 57 who are alive and well.  He has 2 children, ages 14 and 60.  ROS General: Negative; No fevers, chills, or night sweats;  HEENT: Negative; No changes in vision or hearing, sinus congestion, difficulty swallowing Pulmonary: Negative; No cough, wheezing, shortness of breath, hemoptysis Cardiovascular: See history of present illness GI: Negative; No nausea, vomiting, diarrhea, or abdominal pain GU: Negative; No dysuria, hematuria, or difficulty  voiding Musculoskeletal: Status post right knee replacement in January 2018. Status post recent left knee surgery 04/17/2017 Hematologic/Oncology: Negative; no easy bruising, bleeding Endocrine: Negative; no heat/cold intolerance; no diabetes Neuro: Negative; no changes in balance, headaches Skin: Negative; No rashes or skin lesions Psychiatric: Negative; No behavioral problems, depression Sleep: Positive for snoring;, daytime sleepiness, hypersomnolence, bruxism, restless legs, hypnogognic hallucinations, no cataplexy Other comprehensive 14 point system review is negative.   PHYSICAL EXAM:   VS:  BP 120/84   Pulse 60   Ht 6' 2"  (1.88 m)   Wt 277 lb (125.6 kg)   BMI 35.56 kg/m     Repeat blood pressure by me 132/90  Wt Readings from Last 3 Encounters:  12/20/17 277 lb (125.6 kg)  05/28/17 272 lb 6.4 oz (123.6 kg)  04/01/17 271 lb 3.2 oz (123 kg)    General: Alert, oriented, no distress.  Skin: normal turgor, no rashes, warm and dry HEENT: Normocephalic, atraumatic. Pupils equal round and reactive to light; sclera anicteric; extraocular muscles intact;  Nose without nasal septal hypertrophy Mouth/Parynx benign; Mallinpatti scale 3Neck: No JVD, no carotid bruits; normal carotid upstroke Lungs: clear to ausculatation and percussion; no wheezing or rales Chest wall: without tenderness to palpitation Heart: PMI not displaced, RRR, s1 s2 normal, 1/6 systolic murmur, no diastolic murmur, no rubs, gallops, thrills, or heaves Abdomen: soft, nontender; no hepatosplenomehaly, BS+; abdominal aorta nontender and not dilated by palpation. Back: no CVA tenderness Pulses 2+ Musculoskeletal: full range of motion, normal strength, no joint deformities Extremities: no clubbing cyanosis or edema, Homan's sign negative  Neurologic: grossly nonfocal; Cranial nerves grossly wnl Psychologic: Normal mood and affect   Studies/Labs Reviewed:   ECG (independently read by me): Normal sinus rhythm at  60 bpm.  PR interval 200 ms.  No ST segment changes.  October 2018 EKG:  EKG is ordered today.  Normal sinus rhythm at 72 bpm.  No ectopy.  Normal intervals.  Q wave in 3.  August 2018 ECG (independently read by me): Normal sinus rhythm at 67 bpm.  Borderline first-degree AV block with a PR interval of 206 ms.  TC interval normal at 388 ms.  No ST segment changes.  Small Q wave in lead 3, nondiagnostic  July 2018 ECG (independently read by me): Normal sinus rhythm at 74 bpm.  Normal intervals.  No ST segment changes.  No ectopy.  May 2018 ECG (independently read by me): Normal sinus rhythm at 66 bpm.  Borderline first degree AV block with a PR interval at 204 ms.  No ST segment changes.  Nondiagnostic Q wave in lead 3.  Recent Labs: BMP Latest Ref Rng & Units 06/14/2017 04/05/2017 03/29/2017  Glucose 65 - 99 mg/dL 111(H) 79 168(H)  BUN 6 - 24 mg/dL 18 18 23   Creatinine 0.76 - 1.27 mg/dL 1.15 1.23 1.50(H)  BUN/Creat Ratio 9 - 20 16 15 15   Sodium 134 - 144 mmol/L 139 143 136  Potassium 3.5 - 5.2 mmol/L 4.7 4.8 4.6  Chloride 96 - 106 mmol/L 102 105 97  CO2 20 - 29 mmol/L 22 21 21   Calcium 8.7 - 10.2 mg/dL 9.8 9.9 9.7     Hepatic Function Latest Ref Rng & Units 03/29/2017 02/14/2017  Total Protein 6.0 - 8.5 g/dL 7.1 6.6  Albumin 3.5 - 5.5 g/dL 4.4 4.2  AST 0 - 40 IU/L 23 24  ALT 0 - 44 IU/L 39 54(H)  Alk Phosphatase 39 - 117 IU/L 86 68  Total Bilirubin 0.0 - 1.2 mg/dL 0.6 0.6    CBC Latest Ref Rng & Units 02/14/2017  WBC 3.4 - 10.8 x10E3/uL 6.2  Hemoglobin 13.0 - 17.7 g/dL 14.5  Hematocrit 37.5 - 51.0 % 43.1  Platelets 150 - 379 x10E3/uL 262   Lab Results  Component Value Date   MCV 86 02/14/2017   Lab Results  Component Value Date   TSH 1.880 02/14/2017   No results found for: HGBA1C   BNP No results found for: BNP  ProBNP No results found for: PROBNP   Lipid Panel     Component Value Date/Time   CHOL 168 02/14/2017 0838   TRIG 90 02/14/2017 0838   HDL 66  02/14/2017 0838   CHOLHDL 2.5 02/14/2017 0838   LDLCALC 84 02/14/2017 0838     RADIOLOGY: No results found.   Additional studies/ records that were reviewed today include:  I reviewed the records from [place] including office evaluations as well as laboratory.  ECHO Study Conclusions: 01/29/17  - Left ventricle: The cavity size was normal. Wall thickness was   increased in a pattern of moderate LVH. Systolic function was   normal. The estimated ejection fraction was in the range of 60%   to 65%. Wall motion was normal; there were no regional wall   motion abnormalities. There was no evidence of elevated   ventricular filling pressure by Doppler parameters. - Aortic valve: Trileaflet; mildly thickened, mildly calcified   leaflets. - Aorta: Aortic root dimension: 43 mm (ED). - Ascending aorta: The ascending aorta was mildly dilated. - Left atrium: The atrium was mildly dilated. - Right ventricle: The cavity size was mildly dilated. Wall   thickness was normal.  ASSESSMENT:    1. Essential hypertension   2. Snoring   3. Non-restorative sleep   4. Nocturia   5. Stage III chronic kidney disease ([place])   6. Type 2 diabetes mellitus without complication, without long-term current use of insulin (HCC)   7. Class  2 severe obesity due to excess calories with serious comorbidity and body mass index (BMI) of 35.0 to 35.9 in adult [facility])      PLAN:  Mr. Athony Coppa is a 52 year old male who has a history of obesity and at least a 15 year history of hypertension and three-year history of type 2 diabetes mellitus.  Despite medical management, his blood pressure continued to be elevated which led to his initial evaluation with me on 01/15/2017.  When I initially saw him, he already was on amlodipine 10 mg, lisinopril HCT 40/25 mg, metoprolol 50 mg twice a day and I recommended the addition of spironolactone initially at 12.5 mg daily.  When I saw him in follow-up, he felt improved, but due  to slight continued blood pressure elevation .  I further titrated spironolactone to 25 mg prior to his knee surgery.  He was taken diclofenac and was on the combination lisinopril HCT.  Laboratory at Dr. Jarrett Ables office had shown an increasing creatinine to 1.5.  With discontinuance of diclofenac and changing him to just lisinopril alone renal function improved.  Subsequent creatinine was 1.23.  Knee surgery well without cardiovascular compromise.  Over the past 6 months he is now much more active and denies any knee discomfort.  Not had any episodes of chest pain.  He denies shortness of breath.  His blood pressure today is stable and at 112/80 when taken by me.  I have suggested he discontinue HCTZ and only take this on an as-needed basis if leg swelling has occurred.  He feels his blood pressure became significantly improved once his spironolactone was increased.  He is to be on amlodipine 10 mg, lisinopril 40 mg, in addition to the metoprolol 50 twice daily and spironolactone 25 mg daily.  He is diabetic on glyburide.  MI is increased at 35.56.  I discussed the importance of weight loss and increased exercise particularly now that his knee discomfort has resolved.  He has had issues with recent increasing snoring according to his wife.  3 of cardiovascular comorbidities, and hypertension, in addition to nocturia 3 times per night I have recommended a sleep study for evaluation of obstructive sleep apnea.  Try to do this as an in lab study but if this is denied by insurance I will see him in follow-up in   Medication Adjustments/Labs and Tests Ordered: Current medicines are reviewed at length with the patient today.  Concerns regarding medicines are outlined above.  Medication changes, Labs and Tests ordered today are listed in the Patient Instructions below. Patient Instructions  Medication Instructions:  Your physician recommends that you continue on your current medications as directed. Please refer to  the Current Medication list given to you today.  Testing/Procedures: Your physician has recommended that you have a sleep study. This test records several body functions during sleep, including: brain activity, eye movement, oxygen and carbon dioxide blood levels, heart rate and rhythm, breathing rate and rhythm, the flow of air through your mouth and nose, snoring, body muscle movements, and chest and belly movement.  Follow-Up: Your physician wants you to follow-up in: 6 months with [name] (unless needed sooner pending sleep study results). You will receive a reminder letter in the mail two months in advance. If you don't receive a letter, please call our office to schedule the follow-up appointment.      If you need a refill on your cardiac medications before your next appointment, please call your pharmacy.  Signed, [name], MD  12/22/2017 11:08 AM    [facility] [address], [place], [place], [place]  [address] Phone: [phone]

## 2017-12-22 ENCOUNTER — Encounter: Payer: Self-pay | Admitting: Cardiovascular Disease

## 2017-12-24 ENCOUNTER — Telehealth: Payer: Self-pay | Admitting: *Deleted

## 2017-12-24 NOTE — Telephone Encounter (Signed)
-----   Message from Silverio Lay, RN sent at 12/20/2017  9:46 AM EDT ----- Regarding: Split night Split night ordered, please pre cert and schedule.    Thanks!

## 2017-12-24 NOTE — Telephone Encounter (Signed)
Patient notified of sleep study appointment scheduled on 01/16/18. Per [place], no PA required. Reference # [id number].

## 2018-01-05 ENCOUNTER — Other Ambulatory Visit: Payer: Self-pay | Admitting: Cardiovascular Disease

## 2018-01-16 ENCOUNTER — Ambulatory Visit (HOSPITAL_BASED_OUTPATIENT_CLINIC_OR_DEPARTMENT_OTHER): Payer: BLUE CROSS/BLUE SHIELD | Attending: Cardiovascular Disease | Admitting: Cardiovascular Disease

## 2018-01-16 VITALS — Ht 74.0 in | Wt 275.0 lb

## 2018-01-16 DIAGNOSIS — E669 Obesity, unspecified: Secondary | ICD-10-CM | POA: Diagnosis not present

## 2018-01-16 DIAGNOSIS — Z6835 Body mass index (BMI) 35.0-35.9, adult: Secondary | ICD-10-CM | POA: Insufficient documentation

## 2018-01-16 DIAGNOSIS — G4730 Sleep apnea, unspecified: Secondary | ICD-10-CM | POA: Diagnosis not present

## 2018-01-16 DIAGNOSIS — R5383 Other fatigue: Secondary | ICD-10-CM | POA: Diagnosis not present

## 2018-01-16 DIAGNOSIS — I10 Essential (primary) hypertension: Secondary | ICD-10-CM | POA: Insufficient documentation

## 2018-01-16 DIAGNOSIS — G4736 Sleep related hypoventilation in conditions classified elsewhere: Secondary | ICD-10-CM | POA: Insufficient documentation

## 2018-01-16 DIAGNOSIS — G4733 Obstructive sleep apnea (adult) (pediatric): Secondary | ICD-10-CM

## 2018-01-16 DIAGNOSIS — G4719 Other hypersomnia: Secondary | ICD-10-CM | POA: Diagnosis not present

## 2018-01-16 DIAGNOSIS — R0683 Snoring: Secondary | ICD-10-CM | POA: Diagnosis not present

## 2018-01-16 DIAGNOSIS — Z79899 Other long term (current) drug therapy: Secondary | ICD-10-CM | POA: Insufficient documentation

## 2018-01-16 DIAGNOSIS — R351 Nocturia: Secondary | ICD-10-CM

## 2018-01-16 DIAGNOSIS — G478 Other sleep disorders: Secondary | ICD-10-CM

## 2018-01-24 ENCOUNTER — Encounter (HOSPITAL_BASED_OUTPATIENT_CLINIC_OR_DEPARTMENT_OTHER): Payer: Self-pay | Admitting: Cardiovascular Disease

## 2018-01-24 NOTE — Procedures (Signed)
Patient Name: Gregory Khan, Gregory Khan Date: 01/16/2018 Gender: Male D.O.B: [birth year] Age (years): 77 Referring Provider: [name] MD, ABSM Height (inches): 74 Interpreting Physician: [name] MD, ABSM Weight (lbs): 275 RPSGT: Laren Everts BMI: 35 MRN: [id number] Neck Size: 17.50  CLINICAL INFORMATION Sleep Study Type: NPSG  Indication for sleep study: Excessive Daytime Sleepiness, Fatigue, Hypertension, Obesity, Snoring  Epworth Sleepiness Score: 7  SLEEP STUDY TECHNIQUE As per the AASM Manual for the Scoring of Sleep and Associated Events v2.3 (April 2016) with a hypopnea requiring 4% desaturations.  The channels recorded and monitored were frontal, central and occipital EEG, electrooculogram (EOG), submentalis EMG (chin), nasal and oral airflow, thoracic and abdominal wall motion, anterior tibialis EMG, snore microphone, electrocardiogram, and pulse oximetry.  MEDICATIONS     amLODipine (NORVASC) 10 MG tablet         atorvastatin (LIPITOR) 20 MG tablet         glyBURIDE (DIABETA) 2.5 MG tablet         hydrochlorothiazide (MICROZIDE) 12.5 MG capsule         lisinopril (PRINIVIL,ZESTRIL) 40 MG tablet (Expired)         metoprolol tartrate (LOPRESSOR) 50 MG tablet         spironolactone (ALDACTONE) 25 MG tablet      Medications self-administered by patient taken the night of the study : N/A  SLEEP ARCHITECTURE The study was initiated at 9:58:53 PM and ended at 4:05:41 AM.  Sleep onset time was 29.6 minutes and the sleep efficiency was 40.9%. The total sleep time was 150 minutes.  Stage REM latency was 228.0 minutes.  The patient spent 20.33% of the night in stage N1 sleep, 70.00% in stage N2 sleep, 0.00% in stage N3 and 9.67% in REM.  Alpha intrusion was absent.  Supine sleep was 0.00%.  RESPIRATORY PARAMETERS The overall apnea/hypopnea index (AHI) was 3.2 per hour. The respiratory disturbance index (RDI) was increased at 15.6 per hour. There were 1  total apneas, including 0 obstructive, 1 central and 0 mixed apneas. There were 7 hypopneas and 31 RERAs.  The AHI during Stage REM sleep was 24.8 per hour.  AHI while supine was N/A per hour.  The mean oxygen saturation was 92.60%. The minimum SpO2 during sleep was 83.00%.  Soft snoring was noted during this study.  CARDIAC DATA The 2 lead EKG demonstrated sinus rhythm. The mean heart rate was 58.23 beats per minute. Other EKG findings include: None.  LEG MOVEMENT DATA The total PLMS were 243 with a resulting PLMS index of 97.20. Associated arousal with leg movement index was 20.0.  IMPRESSIONS - Increased Upper Airway Resistance overall (AHI 3.2/h); however, the RDI was moderately increased at 15.6/h. Sleep apnea was moderate during REM sleep with an AHI of 24.8/h.  Due to the poor sleep efficiency at only 40.9%, and absence of supine sleep, I suspect the overall severity of sleep apnea is underestimated by the AHI. - No significant central sleep apnea occurred during this study (CAI = 0.4/h). - Severe oxygen desaturation to a nadir of 83%. - The patient snored with soft snoring volume. - Reduced sleep efficiency at 40.9%.  Wake after sleep onset was 187.2 minutes. - Abnormal sleep architecture with absence of slow-wave sleep and prolonged latency to REM sleep. - No cardiac abnormalities were noted during this study. - Severe periodic limb movements of sleep occurred during the study. Associated arousals were significant.  DIAGNOSIS - Sleep apnea, unspecified type.  G47.30 -  Nocturnal Hypoxemia (327.26 [G47.36 ICD-10])  RECOMMENDATIONS - In this symptomaticpatient with cardiovascular comorbidities, the patient may benefit from CPAP titration trial or alternatives to CPAP therapy such as customized oral appliance if CPAP is not improved by insurance. A follow-up evaluation in the future may be necessary with a sleep aid to allow for more optimal sleep efficiency if a subsequent  evaluation is necessary. - Effort should be made to optimize nasal and oropharyngeal patency. - Avoid alcohol, sedatives and other CNS depressants that may worsen sleep apnea and disrupt normal sleep architecture. - Sleep hygiene should be reviewed to assess factors that may improve sleep quality. - Weight management and regular exercise should be initiated or continued if appropriate.  [Electronically signed] 01/24/2018 04:42 PM  [name] MD, Tomasa Hose Diplomate, American Board of Sleep Medicine   NPI: [id number] [place] PH: [phone]   FX: [phone] [place]

## 2018-01-29 ENCOUNTER — Other Ambulatory Visit: Payer: Self-pay | Admitting: Cardiovascular Disease

## 2018-01-29 ENCOUNTER — Encounter: Payer: Self-pay | Admitting: Cardiovascular Disease

## 2018-01-29 ENCOUNTER — Telehealth: Payer: Self-pay | Admitting: *Deleted

## 2018-01-29 DIAGNOSIS — G4733 Obstructive sleep apnea (adult) (pediatric): Secondary | ICD-10-CM

## 2018-01-29 NOTE — Telephone Encounter (Signed)
-----   Message from Troy Sine, MD sent at 01/24/2018  4:48 PM EDT ----- Mariann Laster, please notify patient of the result.  Ideally, patient would benefit from CPAP titration study if insurance will approve.  Although his AHI was just below 5, RDI was moderate and he had moderately severe sleep apnea, during REM sleep with significant oxygen desaturation to a nadir of 83%.

## 2018-01-29 NOTE — Progress Notes (Signed)
Patient notified of results and recommendations and agrees to proceed with CPAP titration.

## 2018-01-29 NOTE — Telephone Encounter (Signed)
Patient notified of results and recommendations and agrees to CPAP titration.

## 2018-01-30 ENCOUNTER — Encounter: Payer: Self-pay | Admitting: Cardiovascular Disease

## 2018-01-30 ENCOUNTER — Other Ambulatory Visit: Payer: Self-pay | Admitting: *Deleted

## 2018-01-30 ENCOUNTER — Telehealth: Payer: Self-pay | Admitting: *Deleted

## 2018-01-30 NOTE — Telephone Encounter (Signed)
Left CPAP titration appointment along with Elvina Sidle 's contact information in case he needs to change the appointment on patient's cell VM. (ok per dpr).

## 2018-02-03 ENCOUNTER — Other Ambulatory Visit: Payer: Self-pay | Admitting: Cardiovascular Disease

## 2018-02-22 ENCOUNTER — Encounter (HOSPITAL_BASED_OUTPATIENT_CLINIC_OR_DEPARTMENT_OTHER): Payer: BLUE CROSS/BLUE SHIELD

## 2018-02-27 ENCOUNTER — Ambulatory Visit (HOSPITAL_BASED_OUTPATIENT_CLINIC_OR_DEPARTMENT_OTHER): Payer: BLUE CROSS/BLUE SHIELD | Attending: Cardiovascular Disease | Admitting: Cardiovascular Disease

## 2018-02-27 VITALS — Ht 74.0 in | Wt 277.0 lb

## 2018-02-27 DIAGNOSIS — G4733 Obstructive sleep apnea (adult) (pediatric): Secondary | ICD-10-CM | POA: Diagnosis not present

## 2018-02-27 DIAGNOSIS — Z79899 Other long term (current) drug therapy: Secondary | ICD-10-CM | POA: Diagnosis not present

## 2018-02-27 DIAGNOSIS — Z7984 Long term (current) use of oral hypoglycemic drugs: Secondary | ICD-10-CM | POA: Diagnosis not present

## 2018-02-27 DIAGNOSIS — G4761 Periodic limb movement disorder: Secondary | ICD-10-CM | POA: Insufficient documentation

## 2018-03-08 ENCOUNTER — Encounter (HOSPITAL_BASED_OUTPATIENT_CLINIC_OR_DEPARTMENT_OTHER): Payer: Self-pay | Admitting: Cardiovascular Disease

## 2018-03-08 NOTE — Procedures (Signed)
Patient Name: Gregory Khan, Gregory Khan Date: 02/27/2018 Gender: Male D.O.B: Nov 03, 1965 Age (years): 40 Referring Provider: [name] MD, ABSM Height (inches): 74 Interpreting Physician: [name] MD, ABSM Weight (lbs): 275 RPSGT: Lanae Boast BMI: 35 MRN: [id number] Neck Size: 17.50  CLINICAL INFORMATION The patient is referred for a CPAP titration to treat sleep apnea.  Date of NPSG:  Jan 16, 2018: AHI 3.2/h; RDI 15.6/h; REM sleep AHI 24.8/h; O2 desaturation to 83%.  SLEEP STUDY TECHNIQUE As per the AASM Manual for the Scoring of Sleep and Associated Events v2.3 (April 2016) with a hypopnea requiring 4% desaturations.  The channels recorded and monitored were frontal, central and occipital EEG, electrooculogram (EOG), submentalis EMG (chin), nasal and oral airflow, thoracic and abdominal wall motion, anterior tibialis EMG, snore microphone, electrocardiogram, and pulse oximetry. Continuous positive airway pressure (CPAP) was initiated at the beginning of the study and titrated to treat sleep-disordered breathing.  MEDICATIONS     amLODipine (NORVASC) 10 MG tablet         atorvastatin (LIPITOR) 20 MG tablet         glyBURIDE (DIABETA) 2.5 MG tablet         hydrochlorothiazide (MICROZIDE) 12.5 MG capsule         lisinopril (PRINIVIL,ZESTRIL) 40 MG tablet (Expired)         metoprolol tartrate (LOPRESSOR) 50 MG tablet         spironolactone (ALDACTONE) 25 MG tablet      Medications self-administered by patient taken the night of the study : N/A  TECHNICIAN COMMENTS Comments added by technician: Patient was restless all through the night. Comments added by scorer: N/A  RESPIRATORY PARAMETERS Optimal PAP Pressure (cm): 14 AHI at Optimal Pressure (/hr): 0.0 Overall Minimal O2 (%): 91.0 Supine % at Optimal Pressure (%): 100 Minimal O2 at Optimal Pressure (%): 91.0   SLEEP ARCHITECTURE The study was initiated at 11:05:47 PM and ended at 5:26:48 AM.  Sleep  onset time was 6.9 minutes and the sleep efficiency was 64.7%%. The total sleep time was 246.5 minutes.  The patient spent 5.7%% of the night in stage N1 sleep, 79.9%% in stage N2 sleep, 0.0%% in stage N3 and 14.40% in REM.Stage REM latency was 287.0 minutes  Wake after sleep onset was 127.6. Alpha intrusion was absent. Supine sleep was 12.17%.  CARDIAC DATA The 2 lead EKG demonstrated sinus rhythm. The mean heart rate was 65.3 beats per minute. Other EKG findings include: None.  LEG MOVEMENT DATA The total Periodic Limb Movements of Sleep (PLMS) were 322. The PLMS index was 78.4. A PLMS index of <15 is considered normal in adults.  IMPRESSIONS - CPAP was initiated at 5 cm and was titrated to optimal PAP pressure at 14 cm of water. - Central sleep apnea was not noted during this titration (CAI = 0.2/h). - Significant oxygen desaturations were not observed during this titration (O2 nadir 91.0%). - The patient snored with loud snoring volume during this titration study. - No cardiac abnormalities were observed during this study. - Clinically significant periodic limb movements were noted during this study. Arousals associated with PLMs were significant.  DIAGNOSIS - Obstructive Sleep Apnea (327.23 [G47.33 ICD-10]) - Periodic Limb Movement During Sleep (327.51 [G47.61 ICD-10])  RECOMMENDATIONS - Recommend an initial trial of CPAP therapy with EPR of 3 at 14 cm H2O with heated humidification. A Medium size Fisher&Paykel Full Face Mask Simplus mask was used for the titration.  - Effort should be made to optimize  nasal and oropharyngeal patency. - If patient is symptomatic with restless legs, consider a trial of pharmacotherapy with a PLMS index of 78.4. - Avoid alcohol, sedatives and other CNS depressants that may worsen sleep apnea and disrupt normal sleep architecture. - Sleep hygiene should be reviewed to assess factors that may improve sleep quality. - Weight management and regular  exercise should be initiated or continued. - Recommend a download bwe obtained in 30 days and sleep clinic evaluation after 4 weeks of therapy -   [Electronically signed] 03/08/2018 05:24 PM  [name] MD, [facility], ABSM Diplomate, American Board of Sleep Medicine   NPI: [id number]  [place] PH: [phone]   FX: [phone] [place]

## 2018-03-11 ENCOUNTER — Telehealth: Payer: Self-pay | Admitting: *Deleted

## 2018-03-11 NOTE — Progress Notes (Signed)
03/11/18 patient notified. Referral sent to  [facility].

## 2018-03-11 NOTE — Telephone Encounter (Signed)
Patient notified sleep study completed and CPAP referral has been made to [facility]. Informed patient that once [facility] submits and receives insurance benefits they will contact him to schedule a set up appointment. Patient also notified it can take up to 1-2 weeks for them to receive a response from the insurance company. If he has not heard from anyone by then he is to call me so that I can do a status check. patient voiced understanding.

## 2018-03-18 DIAGNOSIS — G4733 Obstructive sleep apnea (adult) (pediatric): Secondary | ICD-10-CM | POA: Diagnosis not present

## 2018-03-20 ENCOUNTER — Other Ambulatory Visit: Payer: Self-pay | Admitting: Cardiovascular Disease

## 2018-03-20 DIAGNOSIS — I10 Essential (primary) hypertension: Secondary | ICD-10-CM

## 2018-03-20 DIAGNOSIS — E66811 Obesity, class 1: Secondary | ICD-10-CM

## 2018-03-20 DIAGNOSIS — E119 Type 2 diabetes mellitus without complications: Secondary | ICD-10-CM

## 2018-03-20 DIAGNOSIS — Z79899 Other long term (current) drug therapy: Secondary | ICD-10-CM

## 2018-03-20 DIAGNOSIS — E669 Obesity, unspecified: Secondary | ICD-10-CM

## 2018-03-20 DIAGNOSIS — Z01818 Encounter for other preprocedural examination: Secondary | ICD-10-CM

## 2018-03-20 DIAGNOSIS — N183 Chronic kidney disease, stage 3 unspecified: Secondary | ICD-10-CM

## 2018-03-20 NOTE — Telephone Encounter (Signed)
Rx sent to pharmacy   

## 2018-03-21 ENCOUNTER — Encounter: Payer: Self-pay | Admitting: Cardiovascular Disease

## 2018-04-18 DIAGNOSIS — G4733 Obstructive sleep apnea (adult) (pediatric): Secondary | ICD-10-CM | POA: Diagnosis not present

## 2018-04-20 ENCOUNTER — Other Ambulatory Visit: Payer: Self-pay | Admitting: Cardiovascular Disease

## 2018-05-05 DIAGNOSIS — Z96653 Presence of artificial knee joint, bilateral: Secondary | ICD-10-CM | POA: Diagnosis not present

## 2018-05-13 ENCOUNTER — Ambulatory Visit: Payer: BLUE CROSS/BLUE SHIELD | Admitting: Cardiovascular Disease

## 2018-05-13 ENCOUNTER — Encounter: Payer: Self-pay | Admitting: Cardiovascular Disease

## 2018-05-13 VITALS — BP 144/96 | HR 72 | Ht 74.0 in | Wt 276.2 lb

## 2018-05-13 DIAGNOSIS — G4733 Obstructive sleep apnea (adult) (pediatric): Secondary | ICD-10-CM | POA: Diagnosis not present

## 2018-05-13 DIAGNOSIS — I10 Essential (primary) hypertension: Secondary | ICD-10-CM

## 2018-05-13 DIAGNOSIS — E119 Type 2 diabetes mellitus without complications: Secondary | ICD-10-CM | POA: Diagnosis not present

## 2018-05-13 DIAGNOSIS — G4761 Periodic limb movement disorder: Secondary | ICD-10-CM

## 2018-05-13 DIAGNOSIS — Z6835 Body mass index (BMI) 35.0-35.9, adult: Secondary | ICD-10-CM

## 2018-05-13 NOTE — Patient Instructions (Signed)
Medication Instructions:  Your physician recommends that you continue on your current medications as directed. Please refer to the Current Medication list given to you today.  Follow-Up: Your physician wants you to follow-up in: 4 months with [name].  You will receive a reminder letter in the mail two months in advance. If you don't receive a letter, please call our office to schedule the follow-up appointment.   Any Other Special Instructions Will Be Listed Below (If Applicable).     If you need a refill on your cardiac medications before your next appointment, please call your pharmacy.

## 2018-05-15 ENCOUNTER — Encounter: Payer: Self-pay | Admitting: Cardiovascular Disease

## 2018-05-15 NOTE — Progress Notes (Signed)
Cardiology Office Note    Date:  05/15/2018   ID:  [name], [name] [birth year], MRN [id number]  PCP:  Gregory Dickens, MD  Cardiologist:  Gregory Majestic, MD   Follow-up office visit   History of Present Illness:  Gregory Khan is a 52 y.o. 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  He was initially referred by Dr. Leola Khan at [facility] for cardiology consultation and further evaluation of hypertension.  I  saw for preoperative evaluation prior to knee surgery by Dr. Noemi Khan.  I last saw him in May 2019 and he presents for cardiology/sleep clinic following initiation of CPAP therapy.  Gregory Khan admits to a 15 year history of hypertension as well as a three-year history of type 2 diabetes mellitus.  He also has a history of hyperlipidemia.  He has been followed by Dr. Melanee Khan and recently Dr. Leola Khan at [place].  Remotely, he has been on Benicar HCT, amlodipine and metoprolol for hypertension.  Recently, his blood pressures have consistently been elevated and Benicar was discontinued and he is now on lisinopril/HCT 40/25 mg daily in addition to metoprolol 50 mg twice a day and amlodipine 10 mg daily.  Review of the records from [place] indicate that his blood pressures have been in the 140s to 973Z with diastolics in the low 32D.  Laboratory in 2017 had shown a glucose of 144.  BUN 20, Cr 1.09.  Normal LFTs.  He has been on atorvastatin 20 mg and cholesterol was 161, triglycerides 77, HDL 62, LDL 84.  TSH was normal at 1.4.  Hemoglobin 15 and hematocrit 45.1.  Hemoglobin A1c in January 2018 at improved to 5.7.  Chest x-ray suggested my mild cardiac enlargement without mediastinal widening and was without active cardiac pulmonary disease.  Earlier this year, there was a blood pressure reading in January of 162/105.    When I initially saw him, his blood pressure remained elevated.  With his long-standing hypertension I added  spironolactone for aldosterone  blockade initially at 12.5 mg.   He has felt improved on this.  I also scheduled him for an echo Doppler study which was done on 01/29/2017.  This showed moderate LVH.  He had normal systolic function.  Diastolic parameters were normal.  There was mild aortic root dilation.  His left atrium was mildly dilated.  He underwent subsequent laboratory.  Hemoglobin and hematocrit were stable.  He has mild renal insufficiency.  His most recent creatinine was 1.42.  ALT was minimally increased at 54 with a normal AST at 24.  He has felt improved.  When I last saw him, he had just returned from [place] and worked both in [place] in [place].  At that office visit, I recommended further titration of spironolactone to 25 mg daily since his blood pressure was improved, but remained elevated.  Recently, he had also been taking Voltaren.  He underwent follow-up labwork last week, which now showed a BUN that had risen by his report and 37 and creatinine at 1.50.  He was called by Gregory Khan office.  Voltaren was discontinued.  Repeat chemistry on August 10 showed a BUN had improved to 23 and his creatinine remained at 1.5.  LFTs were normal.  He feels the spironolactone has controlled his blood pressure.  He could sense greater diuretic effect.  He denied any chest pain or palpitations.  He was given clearance to undergo knee surgery.  He did have transient  increasing creatinine which improved with discontinuance of diclofenac.  He also was switched to lisinopril alone instead of combination with HCTZ.  He underwent knee surgery at the outpatient surgical Center on 04/17/2017 without cardiovascular compromise.  When I last saw him, pressure was trending upward upon his return to work from his surgery.  Is eating more and had gained over 5 pounds.  I recommended resumption of HCTZ 12.5 mg every other day.   When I last saw him in May 2019 he was doing well and denied any knee discomfort and was more active following his  surgery.  During that evaluation he informed me that his wife stated that he was snoring with significant intensity.  He was typically going to bed at 10:30 PM and waking up at 6:30 AM.  An Epworth Sleepiness Scale score endorsed at 8.    Refer him for a sleep study which was done on Jan 16, 2018.  This was notable for poor sleep efficiency and absence of supine sleep.  Although his overall AHI was 3.2, his RDI was moderately increased at 15.6.  He also had moderate sleep apnea during rem sleep with an AHI of 24.8 and oxygen desaturation to a nadir of 83%.  He was referred for CPAP titration study and required titration up to 14 cm water pressure.  Of note he also had frequent periodic limb movements of sleep.  He subsequently was set up with CPAP therapy on March 18, 2018 with [facility] [employer] as his DME company.  Admits to improved energy.  A download was obtained from August 24 through May 11, 2018.  He is meeting compliance standards with 77% of usage days.  Usage to than 4 hours was compliant but he was only averaging 4 hours and 48 minutes of CPAP use per night at his 14 cm pressure.  AHI was excellent at 0.7.  He notes improvement in prior nocturia.  He is unaware of any snoring.  He denies residual daytime sleepiness.  An Epworth Sleepiness Scale score was calculated in the office today and this endorsed at 5.  He presents for reevaluation.  Past Medical History:  Diagnosis Date  . Diabetes mellitus without complication ([place])   . Hyperlipidemia   . Hypertension   . Obesity     Past Surgical History:  Procedure Laterality Date  . WISDOM TOOTH EXTRACTION      Current Medications: Outpatient Medications Prior to Visit  Medication Sig Dispense Refill  . amLODipine (NORVASC) 10 MG tablet TAKE 1 TABLET BY MOUTH EVERY DAY 90 tablet 0  . atorvastatin (LIPITOR) 20 MG tablet TAKE 1 TABLET BY MOUTH EVERY DAY 90 tablet 0  . glyBURIDE (DIABETA) 2.5 MG tablet Take 1 tablet (2.5 mg total) by mouth 2  (two) times daily with a meal. 60 tablet 0  . hydrochlorothiazide (MICROZIDE) 12.5 MG capsule Take 12.5 mg by mouth every other day.    . lisinopril (PRINIVIL,ZESTRIL) 40 MG tablet TAKE 1 TABLET BY MOUTH EVERY DAY 90 tablet 0  . metoprolol tartrate (LOPRESSOR) 50 MG tablet TAKE 1 TABLET BY MOUTH TWICE A DAY 180 tablet 2  . spironolactone (ALDACTONE) 25 MG tablet Take 25 mg by mouth daily.    Marland Kitchen spironolactone (ALDACTONE) 25 MG tablet TAKE 1 TABLET BY MOUTH EVERY DAY 90 tablet 1   No facility-administered medications prior to visit.      Allergies:   Patient has no known allergies.   Social History   Socioeconomic History  .  Marital status: Married    Spouse name: Not on file  . Number of children: Not on file  . Years of education: Not on file  . Highest education level: Not on file  Occupational History  . Not on file  Social Needs  . Financial resource strain: Not on file  . Food insecurity:    Worry: Not on file    Inability: Not on file  . Transportation needs:    Medical: Not on file    Non-medical: Not on file  Tobacco Use  . Smoking status: Never Smoker  . Smokeless tobacco: Never Used  Substance and Sexual Activity  . Alcohol use: Yes    Alcohol/week: 0.0 standard drinks    Comment: socially  . Drug use: No  . Sexual activity: Not on file  Lifestyle  . Physical activity:    Days per week: Not on file    Minutes per session: Not on file  . Stress: Not on file  Relationships  . Social connections:    Talks on phone: Not on file    Gets together: Not on file    Attends religious service: Not on file    Active member of club or organization: Not on file    Attends meetings of clubs or organizations: Not on file    Relationship status: Not on file  Other Topics Concern  . Not on file  Social History Narrative  . 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  He  does travel with work.  He is married for 24 years.  He has 2 children.  His undergraduate college was at [employer].  He does drink occasional wine and rum.  His exercise has been limited since his knee replacement, but previously had biked.  Family History:  The patient's family history includes Hypertension in his father.  His mother is currently living at age 46.  His father died at 17 but was a smoker and had previously undergone CABG revascularization surgery.  He has 2 brothers, ages 74 and 60 who are alive and well.  He has 2 children, ages 67 and 10.  ROS General: Negative; No fevers, chills, or night sweats;  HEENT: Negative; No changes in vision or hearing, sinus congestion, difficulty swallowing Pulmonary: Negative; No cough, wheezing, shortness of breath, hemoptysis Cardiovascular: See history of present illness GI: Negative; No nausea, vomiting, diarrhea, or abdominal pain GU: Negative; No dysuria, hematuria, or difficulty voiding Musculoskeletal: Status post right knee replacement in January 2018. Status post recent left knee surgery 04/17/2017 Hematologic/Oncology: Negative; no easy bruising, bleeding Endocrine: Negative; no heat/cold intolerance; no diabetes Neuro: Negative; no changes in balance, headaches Skin: Negative; No rashes or skin lesions Psychiatric: Negative; No behavioral problems, depression Sleep: Positive for OSA, CPAP set up March 18, 2018.  Other comprehensive 14 point system review is negative.   PHYSICAL EXAM:   VS:  BP (!) 144/96   Pulse 72   Ht 6' 2"  (1.88 m)   Wt 276 lb 3.2 oz (125.3 kg)   SpO2 98%   BMI 35.46 kg/m     Repeat blood pressure by me 132/90  Wt Readings from Last 3 Encounters:  05/13/18 276 lb 3.2 oz (125.3 kg)  02/27/18 277 lb (125.6 kg)  01/16/18 275 lb (124.7 kg)    General: Alert, oriented, no distress.  Skin: normal turgor, no rashes,  warm and dry HEENT: Normocephalic, atraumatic. Pupils equal round and reactive to  light; sclera anicteric; extraocular muscles intact;  Nose without nasal septal hypertrophy Mouth/Parynx benign; Mallinpatti scale 3Neck: No JVD, no carotid bruits; normal carotid upstroke Lungs: clear to ausculatation and percussion; no wheezing or rales Chest wall: without tenderness to palpitation Heart: PMI not displaced, RRR, s1 s2 normal, 1/6 systolic murmur, no diastolic murmur, no rubs, gallops, thrills, or heaves Abdomen: soft, nontender; no hepatosplenomehaly, BS+; abdominal aorta nontender and not dilated by palpation. Back: no CVA tenderness Pulses 2+ Musculoskeletal: full range of motion, normal strength, no joint deformities Extremities: no clubbing cyanosis or edema, Homan's sign negative  Neurologic: grossly nonfocal; Cranial nerves grossly wnl Psychologic: Normal mood and affect    Studies/Labs Reviewed:   May 2019 ECG (independently read by me): Normal sinus rhythm at 60 bpm.  PR interval 200 ms.  No ST segment changes.  October 2018 EKG:  EKG is ordered today.  Normal sinus rhythm at 72 bpm.  No ectopy.  Normal intervals.  Q wave in 3.  August 2018 ECG (independently read by me): Normal sinus rhythm at 67 bpm.  Borderline first-degree AV block with a PR interval of 206 ms.  TC interval normal at 388 ms.  No ST segment changes.  Small Q wave in lead 3, nondiagnostic  July 2018 ECG (independently read by me): Normal sinus rhythm at 74 bpm.  Normal intervals.  No ST segment changes.  No ectopy.  May 2018 ECG (independently read by me): Normal sinus rhythm at 66 bpm.  Borderline first degree AV block with a PR interval at 204 ms.  No ST segment changes.  Nondiagnostic Q wave in lead 3.  Recent Labs: BMP Latest Ref Rng & Units 06/14/2017 04/05/2017 03/29/2017  Glucose 65 - 99 mg/dL 111(H) 79 168(H)  BUN 6 - 24 mg/dL 18 18 23   Creatinine 0.76 - 1.27 mg/dL 1.15 1.23 1.50(H)  BUN/Creat Ratio 9 - 20 16 15 15   Sodium 134 - 144 mmol/L 139 143 136  Potassium 3.5 - 5.2 mmol/L  4.7 4.8 4.6  Chloride 96 - 106 mmol/L 102 105 97  CO2 20 - 29 mmol/L 22 21 21   Calcium 8.7 - 10.2 mg/dL 9.8 9.9 9.7     Hepatic Function Latest Ref Rng & Units 03/29/2017 02/14/2017  Total Protein 6.0 - 8.5 g/dL 7.1 6.6  Albumin 3.5 - 5.5 g/dL 4.4 4.2  AST 0 - 40 IU/L 23 24  ALT 0 - 44 IU/L 39 54(H)  Alk Phosphatase 39 - 117 IU/L 86 68  Total Bilirubin 0.0 - 1.2 mg/dL 0.6 0.6    CBC Latest Ref Rng & Units 02/14/2017  WBC 3.4 - 10.8 x10E3/uL 6.2  Hemoglobin 13.0 - 17.7 g/dL 14.5  Hematocrit 37.5 - 51.0 % 43.1  Platelets 150 - 379 x10E3/uL 262   Lab Results  Component Value Date   MCV 86 02/14/2017   Lab Results  Component Value Date   TSH 1.880 02/14/2017   No results found for: HGBA1C   BNP No results found for: BNP  ProBNP No results found for: PROBNP   Lipid Panel     Component Value Date/Time   CHOL 168 02/14/2017 0838   TRIG 90 02/14/2017 0838   HDL 66 02/14/2017 0838   CHOLHDL 2.5 02/14/2017 0838   LDLCALC 84 02/14/2017 0838     RADIOLOGY: No results found.   Additional studies/ records that were reviewed today include:  I reviewed the  records from [place] including office evaluations as well as laboratory.  ECHO Study Conclusions: 01/29/17  - Left ventricle: The cavity size was normal. Wall thickness was   increased in a pattern of moderate LVH. Systolic function was   normal. The estimated ejection fraction was in the range of 60%   to 65%. Wall motion was normal; there were no regional wall   motion abnormalities. There was no evidence of elevated   ventricular filling pressure by Doppler parameters. - Aortic valve: Trileaflet; mildly thickened, mildly calcified   leaflets. - Aorta: Aortic root dimension: 43 mm (ED). - Ascending aorta: The ascending aorta was mildly dilated. - Left atrium: The atrium was mildly dilated. - Right ventricle: The cavity size was mildly dilated. Wall   thickness was normal.  ASSESSMENT:    1. Obstructive  sleep apnea (adult) (pediatric)   2. Essential hypertension   3. Periodic limb movements of sleep   4. Type 2 diabetes mellitus without complication, without long-term current use of insulin (HCC)   5. Class 2 severe obesity due to excess calories with serious comorbidity and body mass index (BMI) of 35.0 to 35.9 in adult [facility])     PLAN:  Gregory Khan is a 52 year old male who has a history of obesity and at least a 15 year history of hypertension and three-year history of type 2 diabetes mellitus.  Despite medical management, his blood pressure continued to be elevated which led to his initial evaluation with me on 01/15/2017.  When I initially saw him, he already was on amlodipine 10 mg, lisinopril HCT 40/25 mg, metoprolol 50 mg twice a day and I recommended the addition of spironolactone initially at 12.5 mg daily.  When I saw him in follow-up, he felt improved, but due to slight continued blood pressure elevation .  I further titrated spironolactone to 25 mg prior to his knee surgery.  He was taken diclofenac and was on the combination lisinopril HCT.  Laboratory at Dr. Archie Endo office had shown an increasing creatinine to 1.5.  With discontinuance of diclofenac and changing him to just lisinopril alone renal function improved.  Subsequent creatinine was 1.23.  Knee surgery well without cardiovascular compromise.  At his last office visit I was concerned about sleep apnea.  Sleep studies were reviewed with him in detail today in the office.  On his initial study there was absence of supine sleep and very poor sleep efficiency which undoubtedly accounted for his initially low AHI.  However he had moderate sleep apnea during rem sleep with an AHI of 24.8 and significant oxygen desaturation to 83%.  He is meeting compliance standards.  I had a long discussion with him today in the office and specifically discussed the importance of increased sleep duration with CPAP therapy.  I discussed with him that  the preponderance of REM sleep occurs in the second half of the night and his sleep apnea is of moderate severity during this time.  We discussed the importance of using CPAP the entire night and he should average ideally 7 to 8 hours of sleep per night with CPAP use.  He was noted to have increased periodic limb movements of sleep.  However he denies painful restless legs.  Blood pressure today is fairly stable and on repeat by me was 136/88 on his regimen of amlodipine 10 mg, lisinopril 40 mg, metoprolol 50 mill grams twice a day, spironolactone 25 mg daily.  He has not been using hydrochlorothiazide but has it available.  He is diabetic on glyburide.  We discussed weight loss his BMI is 35.46.  Now that his knee discomfort has resolved he plans to be more active.  I answered all his questions regarding his CPAP.  We discussed proper cleansing.  I will see him in 4 months for reevaluation.    Time spent: 25 minutes  Medication Adjustments/Labs and Tests Ordered: Current medicines are reviewed at length with the patient today.  Concerns regarding medicines are outlined above.  Medication changes, Labs and Tests ordered today are listed in the Patient Instructions below. Patient Instructions  Medication Instructions:  Your physician recommends that you continue on your current medications as directed. Please refer to the Current Medication list given to you today.  Follow-Up: Your physician wants you to follow-up in: 4 months with [name].  You will receive a reminder letter in the mail two months in advance. If you don't receive a letter, please call our office to schedule the follow-up appointment.   Any Other Special Instructions Will Be Listed Below (If Applicable).     If you need a refill on your cardiac medications before your next appointment, please call your pharmacy.      Signed, Gregory Majestic, MD  05/15/2018 9:02 PM    Gregory Khan [address]  [place], [place], [place]  [address] Phone: [phone]

## 2018-05-19 DIAGNOSIS — G4733 Obstructive sleep apnea (adult) (pediatric): Secondary | ICD-10-CM | POA: Diagnosis not present

## 2018-06-18 DIAGNOSIS — G4733 Obstructive sleep apnea (adult) (pediatric): Secondary | ICD-10-CM | POA: Diagnosis not present

## 2018-07-19 DIAGNOSIS — G4733 Obstructive sleep apnea (adult) (pediatric): Secondary | ICD-10-CM | POA: Diagnosis not present

## 2018-08-18 DIAGNOSIS — G4733 Obstructive sleep apnea (adult) (pediatric): Secondary | ICD-10-CM | POA: Diagnosis not present

## 2018-08-18 IMAGING — CR DG CHEST 2V
2 series · 2 of 2 positions shown · non-contrast
Comparison: None.

CLINICAL DATA: No chest complaints, preop knee surgery.

EXAM:
CHEST  2 VIEW

[w chest pa]
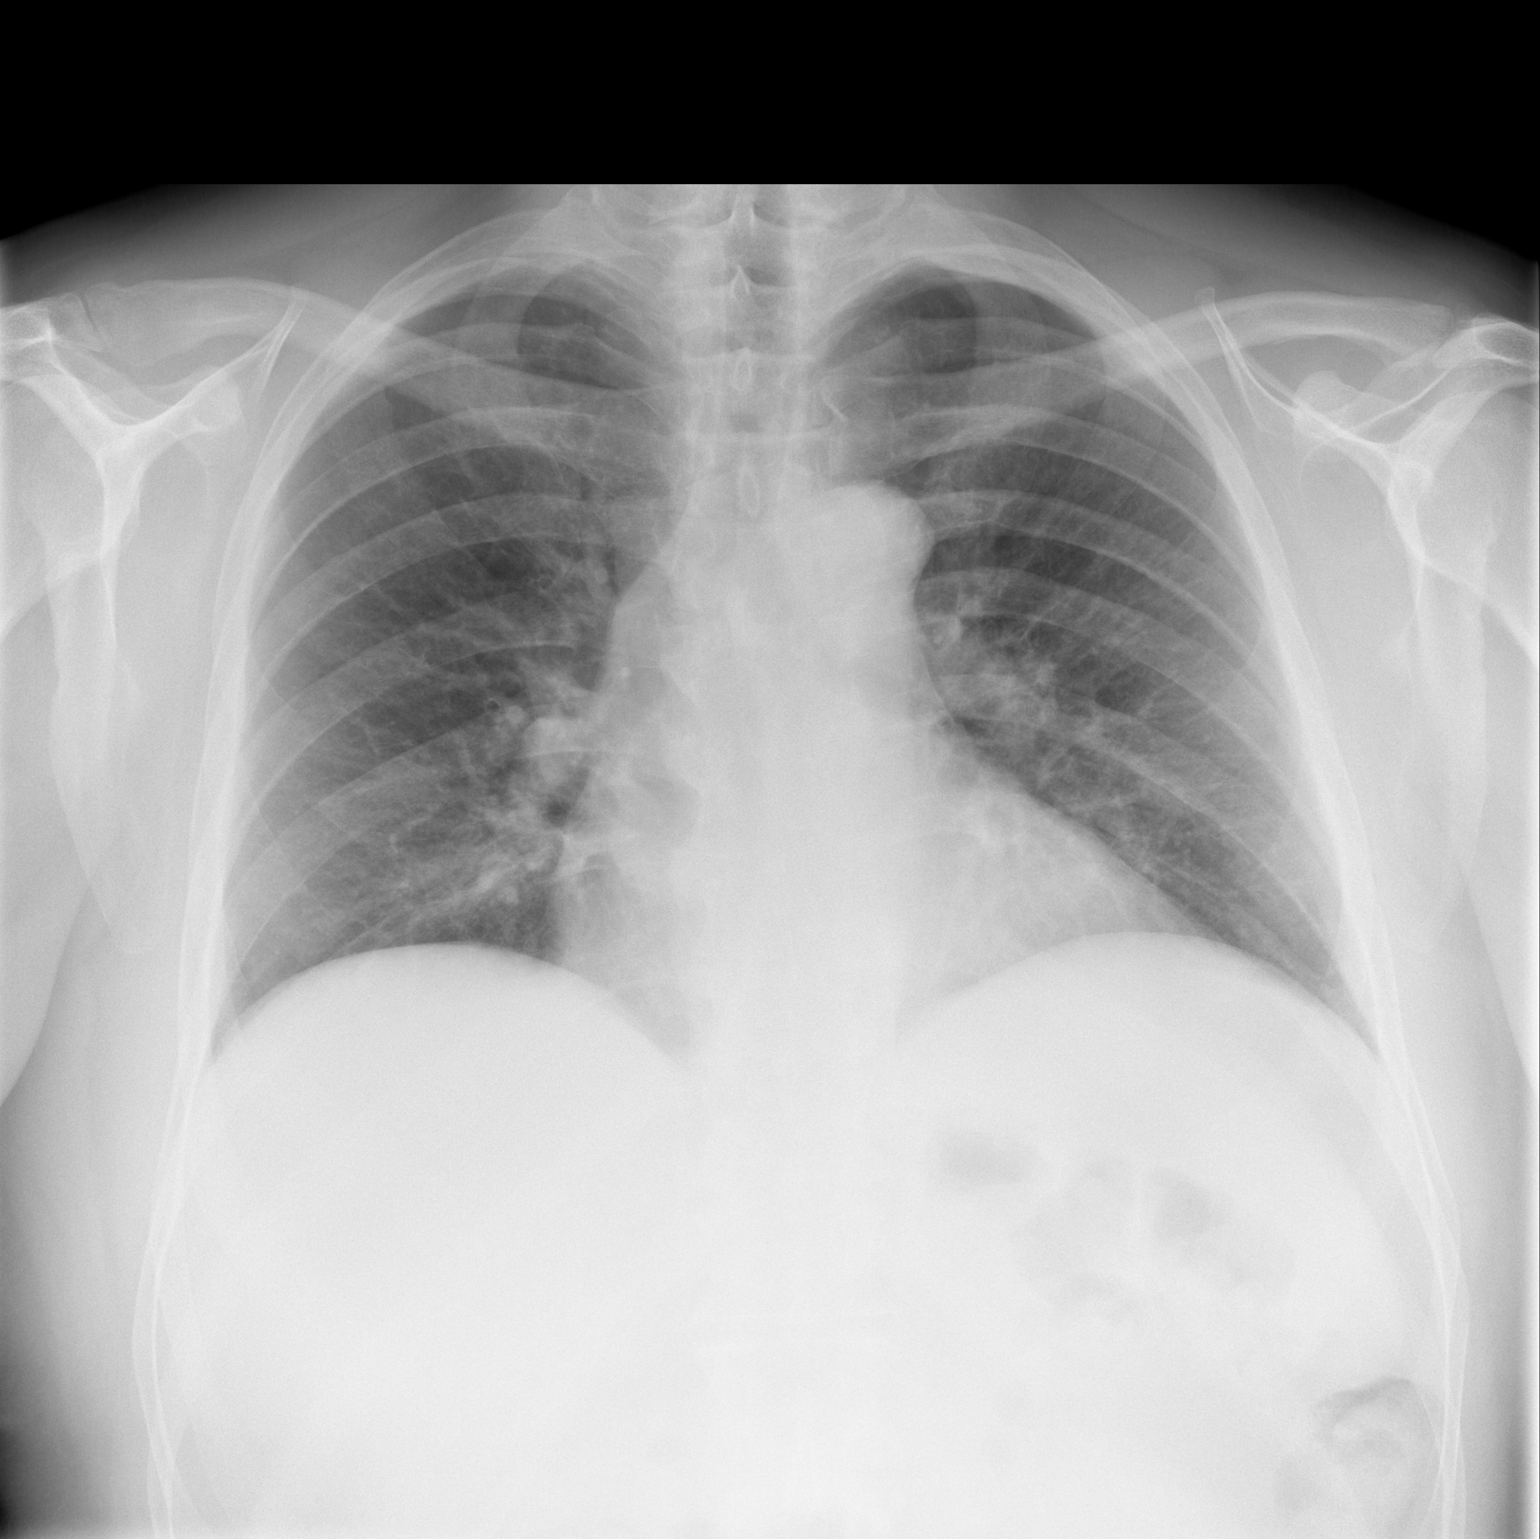

[w chest lat]
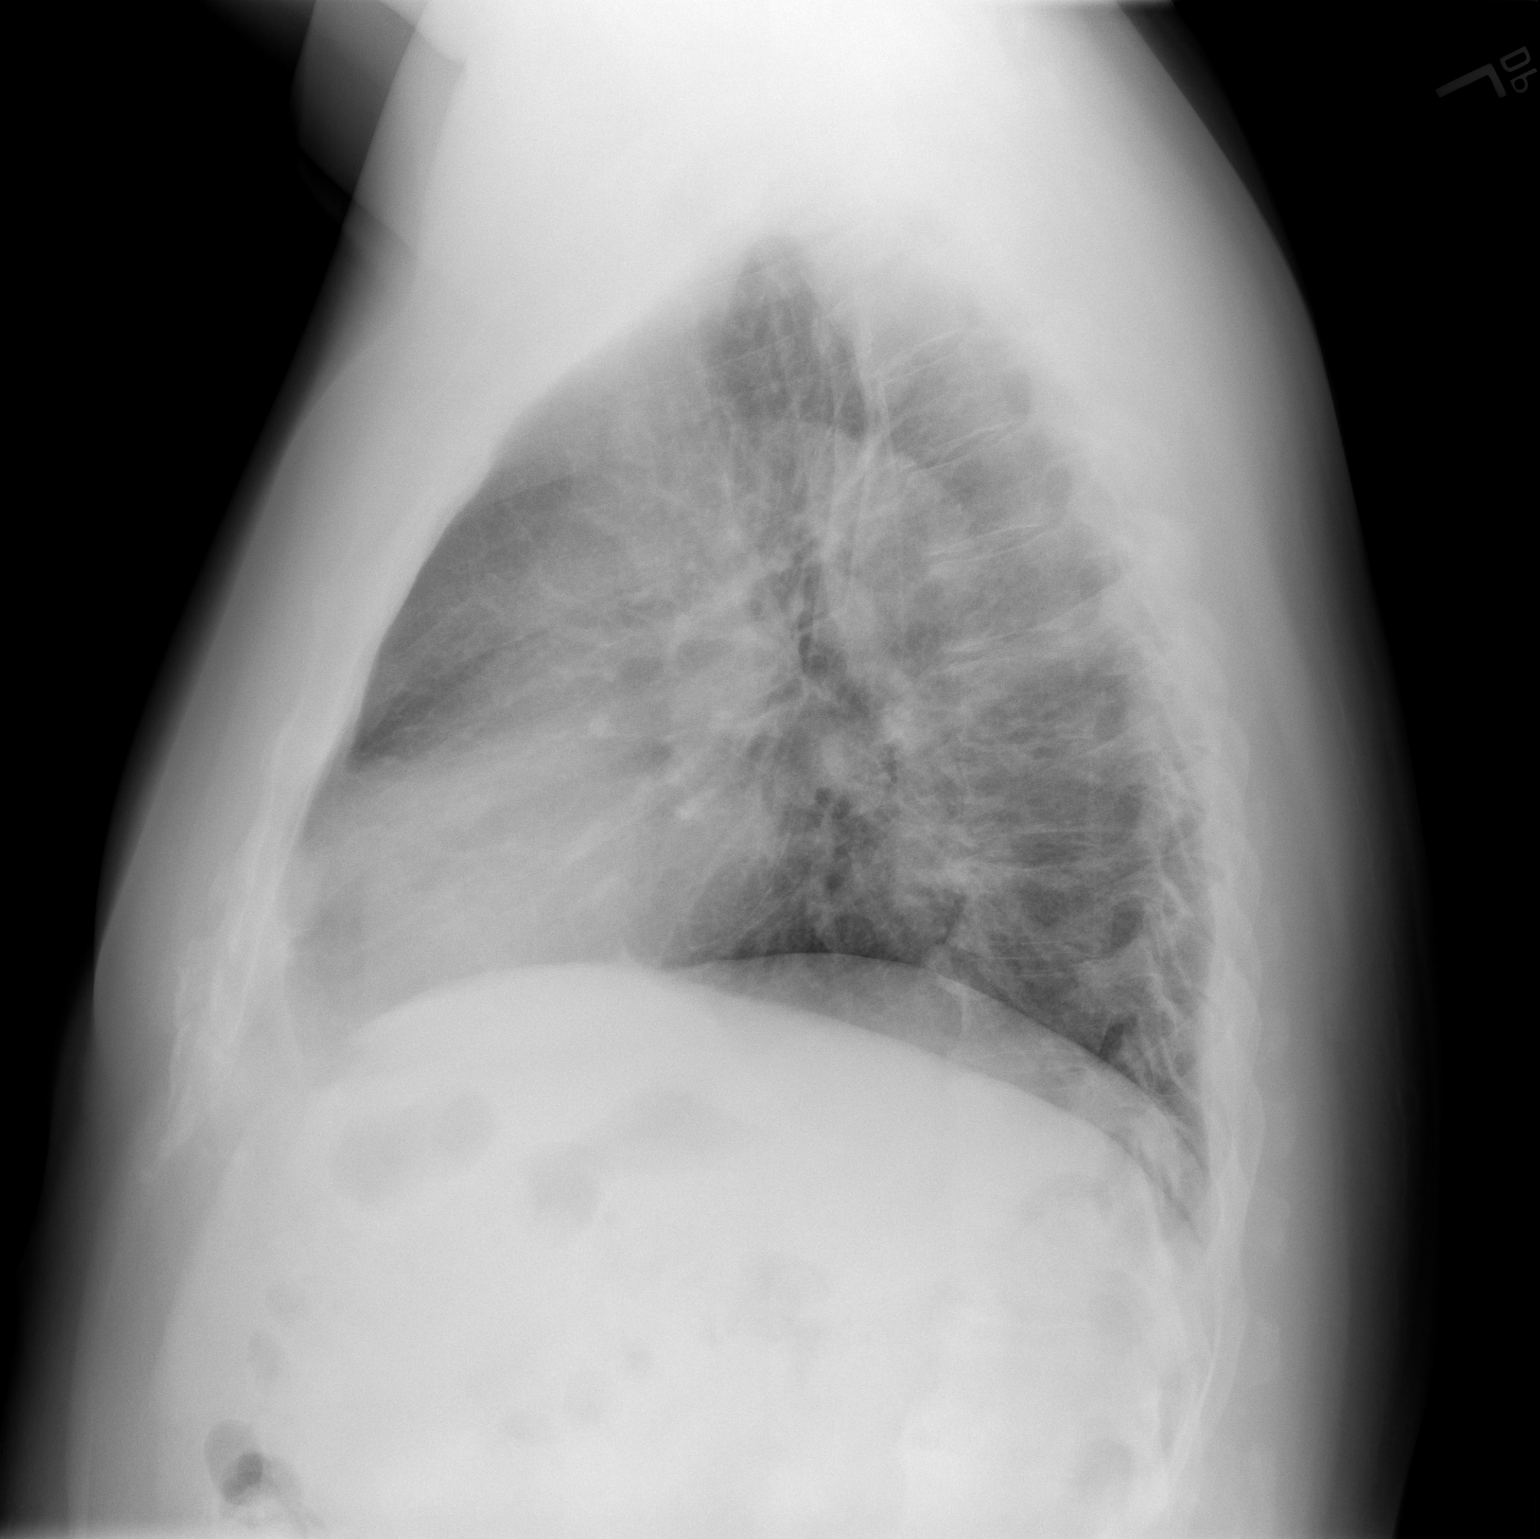

[2 of 2 positions shown; findings below may reference images not displayed]

FINDINGS: Mild cardiac enlargement but no mediastinal widening. Both lungs are
clear. The visualized skeletal structures are unremarkable.
IMPRESSION: No active cardiopulmonary disease.

## 2018-09-18 DIAGNOSIS — G4733 Obstructive sleep apnea (adult) (pediatric): Secondary | ICD-10-CM | POA: Diagnosis not present

## 2018-10-18 DIAGNOSIS — G4733 Obstructive sleep apnea (adult) (pediatric): Secondary | ICD-10-CM | POA: Diagnosis not present

## 2018-10-23 ENCOUNTER — Other Ambulatory Visit: Payer: Self-pay | Admitting: Cardiovascular Disease

## 2018-11-17 DIAGNOSIS — G4733 Obstructive sleep apnea (adult) (pediatric): Secondary | ICD-10-CM | POA: Diagnosis not present

## 2018-12-18 DIAGNOSIS — G4733 Obstructive sleep apnea (adult) (pediatric): Secondary | ICD-10-CM | POA: Diagnosis not present

## 2019-02-03 DIAGNOSIS — G4733 Obstructive sleep apnea (adult) (pediatric): Secondary | ICD-10-CM | POA: Diagnosis not present

## 2019-04-01 DIAGNOSIS — G4733 Obstructive sleep apnea (adult) (pediatric): Secondary | ICD-10-CM | POA: Diagnosis not present

## 2019-04-23 ENCOUNTER — Other Ambulatory Visit: Payer: Self-pay | Admitting: Cardiovascular Disease

## 2019-05-07 DIAGNOSIS — G4733 Obstructive sleep apnea (adult) (pediatric): Secondary | ICD-10-CM | POA: Diagnosis not present

## 2019-08-07 DIAGNOSIS — G4733 Obstructive sleep apnea (adult) (pediatric): Secondary | ICD-10-CM | POA: Diagnosis not present

## 2019-09-02 DIAGNOSIS — M545 Low back pain: Secondary | ICD-10-CM | POA: Diagnosis not present

## 2019-10-09 ENCOUNTER — Ambulatory Visit (INDEPENDENT_AMBULATORY_CARE_PROVIDER_SITE_OTHER): Payer: BC Managed Care – PPO | Admitting: Physician Assistant

## 2019-10-09 ENCOUNTER — Encounter: Payer: Self-pay | Admitting: Physician Assistant

## 2019-10-09 ENCOUNTER — Other Ambulatory Visit: Payer: Self-pay

## 2019-10-09 VITALS — BP 130/88 | HR 65 | Temp 97.9°F | Ht 74.0 in | Wt 288.6 lb

## 2019-10-09 DIAGNOSIS — I712 Thoracic aortic aneurysm, without rupture, unspecified: Secondary | ICD-10-CM

## 2019-10-09 DIAGNOSIS — E785 Hyperlipidemia, unspecified: Secondary | ICD-10-CM | POA: Diagnosis not present

## 2019-10-09 DIAGNOSIS — E119 Type 2 diabetes mellitus without complications: Secondary | ICD-10-CM | POA: Diagnosis not present

## 2019-10-09 DIAGNOSIS — I10 Essential (primary) hypertension: Secondary | ICD-10-CM

## 2019-10-09 DIAGNOSIS — G4733 Obstructive sleep apnea (adult) (pediatric): Secondary | ICD-10-CM

## 2019-10-09 NOTE — Patient Instructions (Signed)
Medication Instructions:   DISCONTINUE HCTZ *If you need a refill on your cardiac medications before your next appointment, please call your pharmacy*  Lab Work: NONE ordered at this time of appointment   If you have labs (blood work) drawn today and your tests are completely normal, you will receive your results only by: Marland Kitchen MyChart Message (if you have MyChart) OR . A paper copy in the mail If you have any lab test that is abnormal or we need to change your treatment, we will call you to review the results.  Testing/Procedures: Non-Cardiac CT Angiography (CTA), is a special type of CT scan that uses a computer to produce multi-dimensional views of major blood vessels throughout the body. In CT angiography, a contrast material is injected through an IV to help visualize the blood vessels   Follow-Up: At [facility], you and your health needs are our priority.  As part of our continuing mission to provide you with exceptional heart care, we have created designated Provider Care Teams.  These Care Teams include your primary Cardiologist (physician) and Advanced Practice Providers (APPs -  Physician Assistants and Nurse Practitioners) who all work together to provide you with the care you need, when you need it.  Your next appointment:   12 month(s)  The format for your next appointment:   In Person  Provider:   [name], MD  Other Instructions

## 2019-10-09 NOTE — Progress Notes (Signed)
Cardiology Office Note:    Date:  10/11/2019   ID:  Gregory Khan, DOB 07-Sep-1965, MRN [id number]  PCP:  [name], MD  Cardiologist:  [name], MD  Electrophysiologist:  None   Referring MD: [name], MD   Chief Complaint  Patient presents with  . Follow-up    seen for [name].     History of Present Illness:    Gregory Khan is a 54 y.o. male with a hx of hypertension, hyperlipidemia, DM2, and OSA on CPAP.  Previous echocardiogram obtained on 01/29/2017 showed a EF 60 to 65%, moderate LVH, dilated ascending aortic root measuring at 43 mm.  Patient presents today for late 1 year follow-up.  He denies any exertional chest pain or shortness of breath.  He has not been using the CPAP on a daily basis recently, encouraged him to restart on the CPAP therapy.  He is no longer on every other day of hydrochlorothiazide however his blood pressure seems to be very well controlled.  Manual blood pressure obtained by myself was 130/88 today.  Recent lab work shows normal TSH.  BUN 24, creatinine 1.16.  Total cholesterol 178, LDL 106, HDL 54.  Hemoglobin A1c was borderline at 6.3.  Overall, he is doing very well from the cardiology perspective.  He is due for a CT angiogram of the chest to look at the thoracic aortic aneurysm that was previously seen on the echocardiogram.  This is nonurgent given lack of symptoms.  He is aware that he will need a basic metabolic panel within 2 weeks prior to the study to recheck his renal function according to office guideline.   Past Medical History:  Diagnosis Date  . Diabetes mellitus without complication ([place])   . Hyperlipidemia   . Hypertension   . Obesity     Past Surgical History:  Procedure Laterality Date  . WISDOM TOOTH EXTRACTION      Current Medications: Current Meds  Medication Sig  . amLODipine (NORVASC) 10 MG tablet TAKE 1 TABLET BY MOUTH EVERY DAY  . atorvastatin (LIPITOR) 20 MG tablet TAKE 1 TABLET BY MOUTH EVERY  DAY  . glyBURIDE (DIABETA) 2.5 MG tablet Take 1 tablet (2.5 mg total) by mouth 2 (two) times daily with a meal.  . lisinopril (PRINIVIL,ZESTRIL) 40 MG tablet TAKE 1 TABLET BY MOUTH EVERY DAY  . metoprolol tartrate (LOPRESSOR) 50 MG tablet TAKE 1 TABLET BY MOUTH TWICE A DAY  . spironolactone (ALDACTONE) 25 MG tablet Take 1 tablet (25 mg total) by mouth daily. *OFFICE VISIT NEEDED FOR FURTHER REFILLS*     Allergies:   Patient has no known allergies.   Social History   Socioeconomic History  . Marital status: Married    Spouse name: Not on file  . Number of children: Not on file  . Years of education: Not on file  . Highest education level: Not on file  Occupational History  . Not on file  Tobacco Use  . Smoking status: Never Smoker  . Smokeless tobacco: Never Used  Substance and Sexual Activity  . Alcohol use: Yes    Alcohol/week: 0.0 standard drinks    Comment: socially  . Drug use: No  . Sexual activity: Not on file  Other Topics Concern  . Not on file  Social History Narrative  . Not on file   Social Determinants of Health   Financial Resource Strain:   . Difficulty of Paying Living Expenses: Not on file  Food  Insecurity:   . Worried About Charity fundraiser in the Last Year: Not on file  . Ran Out of Food in the Last Year: Not on file  Transportation Needs:   . Lack of Transportation (Medical): Not on file  . Lack of Transportation (Non-Medical): Not on file  Physical Activity:   . Days of Exercise per Week: Not on file  . Minutes of Exercise per Session: Not on file  Stress:   . Feeling of Stress : Not on file  Social Connections:   . Frequency of Communication with Friends and Family: Not on file  . Frequency of Social Gatherings with Friends and Family: Not on file  . Attends Religious Services: Not on file  . Active Member of Clubs or Organizations: Not on file  . Attends Archivist Meetings: Not on file  . Marital Status: Not on file      Family History: The patient's family history includes Hypertension in his father. There is no history of Colon cancer or Colon polyps.  ROS:   Please see the history of present illness.     All other systems reviewed and are negative.  EKGs/Labs/Other Studies Reviewed:    The following studies were reviewed today:  Echo 01/29/2017 - Left ventricle: The cavity size was normal. Wall thickness was  increased in a pattern of moderate LVH. Systolic function was  normal. The estimated ejection fraction was in the range of 60%  to 65%. Wall motion was normal; there were no regional wall  motion abnormalities. There was no evidence of elevated  ventricular filling pressure by Doppler parameters.  - Aortic valve: Trileaflet; mildly thickened, mildly calcified  leaflets.  - Aorta: Aortic root dimension: 43 mm (ED).  - Ascending aorta: The ascending aorta was mildly dilated.  - Left atrium: The atrium was mildly dilated.  - Right ventricle: The cavity size was mildly dilated. Wall  thickness was normal.   EKG:  EKG is ordered today.  The ekg ordered today demonstrates normal sinus rhythm with rare PVC.  No obvious ST-T wave changes  Recent Labs: No results found for requested labs within last 8760 hours.  Recent Lipid Panel    Component Value Date/Time   CHOL 168 02/14/2017 0838   TRIG 90 02/14/2017 0838   HDL 66 02/14/2017 0838   CHOLHDL 2.5 02/14/2017 0838   LDLCALC 84 02/14/2017 0838    Physical Exam:    VS:  BP 130/88   Pulse 65   Temp 97.9 F (36.6 C)   Ht 6\' 2"  (1.88 m)   Wt 288 lb 9.6 oz (130.9 kg)   SpO2 95%   BMI 37.05 kg/m     Wt Readings from Last 3 Encounters:  10/09/19 288 lb 9.6 oz (130.9 kg)  05/13/18 276 lb 3.2 oz (125.3 kg)  02/27/18 277 lb (125.6 kg)     GEN:  Well nourished, well developed in no acute distress HEENT: Normal NECK: No JVD; No carotid bruits LYMPHATICS: No lymphadenopathy CARDIAC: RRR, no murmurs, rubs,  gallops RESPIRATORY:  Clear to auscultation without rales, wheezing or rhonchi  ABDOMEN: Soft, non-tender, non-distended MUSCULOSKELETAL:  No edema; No deformity  SKIN: Warm and dry NEUROLOGIC:  Alert and oriented x 3 PSYCHIATRIC:  Normal affect   ASSESSMENT:    1. Thoracic aortic aneurysm without rupture ([place])   2. Essential hypertension   3. Controlled type 2 diabetes mellitus without complication, without long-term current use of insulin ([place])   4. Hyperlipidemia  LDL goal <100   5. OSA (obstructive sleep apnea)    PLAN:    In order of problems listed above:  1. Thoracic aortic aneurysm: Obtain CT angiogram of the chest  2. Hypertension: Continue on current therapy.  He is no longer taking hydrochlorothiazide every other day  3. DM2: Managed by primary care provider  4. Hyperlipidemia: Continue Lipitor  5. Obstructive sleep apnea: He has not been using CPAP therapy on a daily basis, I do encourage him to be more compliant.   Medication Adjustments/Labs and Tests Ordered: Current medicines are reviewed at length with the patient today.  Concerns regarding medicines are outlined above.  Orders Placed This Encounter  Procedures  . CT ANGIO CHEST AORTA W/CM & OR WO/CM  . EKG 12-Lead   No orders of the defined types were placed in this encounter.   Patient Instructions  Medication Instructions:   DISCONTINUE HCTZ *If you need a refill on your cardiac medications before your next appointment, please call your pharmacy*  Lab Work: NONE ordered at this time of appointment   If you have labs (blood work) drawn today and your tests are completely normal, you will receive your results only by: Marland Kitchen MyChart Message (if you have MyChart) OR . A paper copy in the mail If you have any lab test that is abnormal or we need to change your treatment, we will call you to review the results.  Testing/Procedures: Non-Cardiac CT Angiography (CTA), is a special type of CT scan that  uses a computer to produce multi-dimensional views of major blood vessels throughout the body. In CT angiography, a contrast material is injected through an IV to help visualize the blood vessels   Follow-Up: At [facility], you and your health needs are our priority.  As part of our continuing mission to provide you with exceptional heart care, we have created designated Provider Care Teams.  These Care Teams include your primary Cardiologist (physician) and Advanced Practice Providers (APPs -  Physician Assistants and Nurse Practitioners) who all work together to provide you with the care you need, when you need it.  Your next appointment:   12 month(s)  The format for your next appointment:   In Person  Provider:   [name], MD  Other Instructions       Signed, Almyra Deforest, [place]  10/11/2019 12:09 AM    [place]

## 2019-10-11 ENCOUNTER — Encounter: Payer: Self-pay | Admitting: Physician Assistant

## 2019-11-03 DIAGNOSIS — G4733 Obstructive sleep apnea (adult) (pediatric): Secondary | ICD-10-CM | POA: Diagnosis not present

## 2019-12-02 DIAGNOSIS — G4733 Obstructive sleep apnea (adult) (pediatric): Secondary | ICD-10-CM | POA: Diagnosis not present

## 2020-03-01 DIAGNOSIS — G4733 Obstructive sleep apnea (adult) (pediatric): Secondary | ICD-10-CM | POA: Diagnosis not present

## 2020-04-27 ENCOUNTER — Other Ambulatory Visit: Payer: Self-pay

## 2020-04-28 MED ORDER — SPIRONOLACTONE 25 MG PO TABS: 25.0000 mg | ORAL_TABLET | Freq: Every day | ORAL | 6 refills | Status: DC

## 2020-05-12 ENCOUNTER — Telehealth: Payer: Self-pay | Admitting: Cardiovascular Disease

## 2020-05-12 NOTE — Telephone Encounter (Signed)
Left message for patient to call and discuss scheduling CTA chest/aorta due in October 2021---ordered by Dr. [name]

## 2020-05-16 NOTE — Telephone Encounter (Signed)
Left message for patient to call and discuss scheduling CTA chest /aorta ordered by [name]

## 2020-05-17 NOTE — Telephone Encounter (Signed)
Spoke with patient regarding appointment for CTA chest/aorta scheduled Thursday 05/26/20 at 2:20 pm at [facility] [address]----arrival time is2:00pm for check in---liquids only 4 hours prior to study-----patient states he saw the information on My Chart.

## 2020-05-26 ENCOUNTER — Ambulatory Visit
Admission: RE | Admit: 2020-05-26 | Discharge: 2020-05-26 | Disposition: A | Discharge status: Home / Self Care | Payer: BC Managed Care – PPO | Source: Ambulatory Visit | Attending: Physician Assistant | Admitting: Physician Assistant

## 2020-05-26 DIAGNOSIS — I700 Atherosclerosis of aorta: Secondary | ICD-10-CM | POA: Diagnosis not present

## 2020-05-26 DIAGNOSIS — I2510 Atherosclerotic heart disease of native coronary artery without angina pectoris: Secondary | ICD-10-CM | POA: Diagnosis not present

## 2020-05-26 DIAGNOSIS — J9811 Atelectasis: Secondary | ICD-10-CM | POA: Diagnosis not present

## 2020-05-26 DIAGNOSIS — I712 Thoracic aortic aneurysm, without rupture, unspecified: Secondary | ICD-10-CM

## 2020-05-26 MED ORDER — IOPAMIDOL (ISOVUE-370) INJECTION 76%
75.0000 mL | Freq: Once | INTRAVENOUS | Status: AC | PRN
  Administered 2020-05-26: 75 mL via INTRAVENOUS

## 2020-05-27 NOTE — Progress Notes (Signed)
The size of aortic aneurysm is unchanged when compare to 2018, repeat study in 12 month

## 2020-05-31 DIAGNOSIS — G4733 Obstructive sleep apnea (adult) (pediatric): Secondary | ICD-10-CM | POA: Diagnosis not present

## 2020-06-07 ENCOUNTER — Other Ambulatory Visit: Payer: Self-pay

## 2020-06-07 DIAGNOSIS — E669 Obesity, unspecified: Secondary | ICD-10-CM

## 2020-06-07 DIAGNOSIS — Z79899 Other long term (current) drug therapy: Secondary | ICD-10-CM

## 2020-06-07 DIAGNOSIS — Z01818 Encounter for other preprocedural examination: Secondary | ICD-10-CM

## 2020-06-07 DIAGNOSIS — I10 Essential (primary) hypertension: Secondary | ICD-10-CM

## 2020-06-07 DIAGNOSIS — E119 Type 2 diabetes mellitus without complications: Secondary | ICD-10-CM

## 2020-06-07 DIAGNOSIS — N183 Chronic kidney disease, stage 3 unspecified: Secondary | ICD-10-CM

## 2020-06-08 MED ORDER — ATORVASTATIN CALCIUM 20 MG PO TABS
20.0000 mg | ORAL_TABLET | Freq: Every day | ORAL | 0 refills | Status: DC
Start: 2020-06-08 — End: 2021-01-23

## 2020-06-08 MED ORDER — LISINOPRIL 40 MG PO TABS: 40.0000 mg | ORAL_TABLET | Freq: Every day | ORAL | 0 refills | Status: DC

## 2020-06-08 MED ORDER — AMLODIPINE BESYLATE 10 MG PO TABS
10.0000 mg | ORAL_TABLET | Freq: Every day | ORAL | 0 refills | Status: DC
Start: 2020-06-08 — End: 2021-10-01

## 2020-07-19 MED ORDER — METOPROLOL TARTRATE 50 MG PO TABS
50.0000 mg | ORAL_TABLET | Freq: Two times a day (BID) | ORAL | 0 refills | Status: DC
Start: 2020-07-19 — End: 2021-10-01

## 2020-07-26 ENCOUNTER — Other Ambulatory Visit: Payer: Self-pay | Admitting: Cardiovascular Disease

## 2020-09-12 ENCOUNTER — Other Ambulatory Visit: Payer: Self-pay | Admitting: Cardiovascular Disease

## 2020-10-10 ENCOUNTER — Ambulatory Visit: Payer: BC Managed Care – PPO | Admitting: Physician Assistant

## 2020-10-19 ENCOUNTER — Ambulatory Visit: Payer: 59 | Admitting: General Practice

## 2020-11-16 ENCOUNTER — Ambulatory Visit: Payer: 59 | Admitting: Medical

## 2020-11-16 ENCOUNTER — Encounter: Payer: Self-pay | Admitting: Medical

## 2020-11-16 ENCOUNTER — Other Ambulatory Visit: Payer: Self-pay

## 2020-11-16 VITALS — BP 122/84 | HR 65 | Ht 73.5 in | Wt 288.4 lb

## 2020-11-16 DIAGNOSIS — E785 Hyperlipidemia, unspecified: Secondary | ICD-10-CM | POA: Diagnosis not present

## 2020-11-16 DIAGNOSIS — E119 Type 2 diabetes mellitus without complications: Secondary | ICD-10-CM

## 2020-11-16 DIAGNOSIS — I10 Essential (primary) hypertension: Secondary | ICD-10-CM

## 2020-11-16 DIAGNOSIS — I712 Thoracic aortic aneurysm, without rupture, unspecified: Secondary | ICD-10-CM

## 2020-11-16 DIAGNOSIS — I2510 Atherosclerotic heart disease of native coronary artery without angina pectoris: Secondary | ICD-10-CM

## 2020-11-16 DIAGNOSIS — G4733 Obstructive sleep apnea (adult) (pediatric): Secondary | ICD-10-CM

## 2020-11-16 NOTE — Patient Instructions (Addendum)
Medication Instructions:  Continue current medications  *If you need a refill on your cardiac medications before your next appointment, please call your pharmacy*   Lab Work: None Ordered   Testing/Procedures: Non-Cardiac CT scanning in [place], (CAT scanning), is a noninvasive, special x-ray that produces cross-sectional images of the body using x-rays and a computer. CT scans help physicians diagnose and treat medical conditions. For some CT exams, a contrast material is used to enhance visibility in the area of the body being studied. CT scans provide greater clarity and reveal more details than regular x-ray exams.  Follow-Up: At [facility], you and your health needs are our priority.  As part of our continuing mission to provide you with exceptional heart care, we have created designated Provider Care Teams.  These Care Teams include your primary Cardiologist (physician) and Advanced Practice Providers (APPs -  Physician Assistants and Nurse Practitioners) who all work together to provide you with the care you need, when you need it.  We recommend signing up for the patient portal called "MyChart".  Sign up information is provided on this After Visit Summary.  MyChart is used to connect with patients for Virtual Visits (Telemedicine).  Patients are able to view lab/test results, encounter notes, upcoming appointments, etc.  Non-urgent messages can be sent to your provider as well.   To learn more about what you can do with MyChart, go to [email or web address].    Your next appointment:    Next sleep clinic appointment with Dr Claiborne Billings

## 2020-11-16 NOTE — Progress Notes (Signed)
Cardiology Office Note   Date:  11/16/2020   ID:  Blue, Winther 22-Aug-1965, MRN [id number]  PCP:  [name], MD  Cardiologist:  [name], MD EP: None  Chief Complaint  Patient presents with  . Follow-up    HTN, thoracic aneurysm      History of Present Illness: Gregory Khan is a 55 y.o. male with a PMH of HTN, HL, DM type 2, ascending aortic root dilation, and OSA on CPAP, who presents for routine follow-up.  He was last evaluated by cardiology at an outpatient visit with Almyra Deforest, PA-C 09/2019 at which time he was doing well from a cardiology standpoint. He was encouraged to restart CPAP therapy at that visit and repeat a CTA chest for close monitoring of his aortic root dilation. CTA chest 05/2020 showed stable ascending thoracic aorta dilation of 4.2cm with mild aortic atherosclerosis and scattered coronary artery calcifications noted; recommended for ongoing annual monitoring. His last echocardiogram in 2018 showed EF 60-65%, moderate LVH, no RWMA, mild LAE, mild RV dilation, and no significant valvular abnormalities.   He presents today for routine follow-up. He has been doing well from a cardiac standpoint over the past year. No complaints of chest pain, SOB, DOE, dizziness, lightheadedness, syncope, palpitations, orthopnea, PND, or LE edema. He reports working with his PCP for improvement in diabetes as last A1C increased from 6.3 to 6.8. He has not been tolerating trial of metformin. 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 He reports sub par compliance with CPAP mask, wearing for half the night at most which he attributes to mask fitting uncomfortably.     Past Medical History:  Diagnosis Date  . Diabetes mellitus without complication ([place])   . Hyperlipidemia   . Hypertension   . Obesity     Past Surgical History:  Procedure Laterality Date  .  WISDOM TOOTH EXTRACTION       Current Outpatient Medications  Medication Sig Dispense Refill  . amLODipine (NORVASC) 10 MG tablet Take 1 tablet (10 mg total) by mouth daily. 90 tablet 0  . atorvastatin (LIPITOR) 20 MG tablet Take 1 tablet (20 mg total) by mouth daily. 90 tablet 0  . glyBURIDE (DIABETA) 2.5 MG tablet Take 1 tablet (2.5 mg total) by mouth 2 (two) times daily with a meal. 60 tablet 0  . lisinopril (ZESTRIL) 40 MG tablet Take 1 tablet (40 mg total) by mouth daily. 90 tablet 0  . metoprolol tartrate (LOPRESSOR) 50 MG tablet Take 1 tablet (50 mg total) by mouth 2 (two) times daily. 180 tablet 0  . spironolactone (ALDACTONE) 25 MG tablet TAKE 1 TABLET (25 MG TOTAL) BY MOUTH DAILY. *OFFICE VISIT NEEDED FOR FURTHER REFILLS* 30 tablet 1   No current facility-administered medications for this visit.    Allergies:   Patient has no known allergies.    Social History:  The patient  reports that he has never smoked. He has never used smokeless tobacco. He reports current alcohol use. He reports that he does not use drugs.   Family History:  The patient's family history includes Hypertension in his father.    ROS:  Please see the history of present illness.   Otherwise, review of systems are positive for none.   All other systems are reviewed and negative.    PHYSICAL EXAM: VS:  BP 122/84   Pulse  65   Ht 6' 1.5" (1.867 m)   Wt 288 lb 6.4 oz (130.8 kg)   BMI 37.53 kg/m  , BMI Body mass index is 37.53 kg/m. GEN: Well nourished, well developed, in no acute distress HEENT: sclera anicteric Neck: no JVD, carotid bruits, or masses Cardiac: RRR; no murmurs, rubs, or gallops,no edema  Respiratory:  clear to auscultation bilaterally, normal work of breathing GI: soft, nontender, nondistended, + BS MS: no deformity or atrophy Skin: warm and dry, no rash Neuro:  Strength and sensation are intact Psych: euthymic mood, full affect   EKG:  EKG is ordered today. The ekg ordered  today demonstrates sinus rhythm with rate 65 bpm, early repol abnormalities, no STE/D, no TWI.    Recent Labs: No results found for requested labs within last 8760 hours.    Lipid Panel    Component Value Date/Time   CHOL 168 02/14/2017 0838   TRIG 90 02/14/2017 0838   HDL 66 02/14/2017 0838   CHOLHDL 2.5 02/14/2017 0838   LDLCALC 84 02/14/2017 0838      Wt Readings from Last 3 Encounters:  11/16/20 288 lb 6.4 oz (130.8 kg)  10/09/19 288 lb 9.6 oz (130.9 kg)  05/13/18 276 lb 3.2 oz (125.3 kg)      Other studies Reviewed: Additional studies/ records that were reviewed today include:   CTA Chest 05/2020: 1. Measured diameter of proximal ascending thoracic aorta at the level of the sinuses of Valsalva measures 4.2 cm. No progression of aortic dilatation compared to reported measurements from echocardiogram from 2018. Other measurements as described. No dissection. Mild aortic atherosclerosis. Occasional foci of coronary artery calcification noted. Recommend annual imaging followup by CTA or MRA. This recommendation follows 2010 ACCF/AHA/AATS/ACR/ASA/SCA/SCAI/SIR/STS/SVM Guidelines for the Diagnosis and Management of Patients with Thoracic Aortic Disease. Circulation. 2010; 121: Y606-T016. Aortic aneurysm NOS (ICD10-I71.9)  2.  No evident pulmonary embolus.  3. Bibasilar and inferior lingular atelectasis. No edema or airspace opacity.  4.  No evident adenopathy.  5.  Hepatic steatosis.  Echocardiogram 2018: - Left ventricle: The cavity size was normal. Wall thickness was  increased in a pattern of moderate LVH. Systolic function was  normal. The estimated ejection fraction was in the range of 60%  to 65%. Wall motion was normal; there were no regional wall  motion abnormalities. There was no evidence of elevated  ventricular filling pressure by Doppler parameters.  - Aortic valve: Trileaflet; mildly thickened, mildly calcified  leaflets.  - Aorta:  Aortic root dimension: 43 mm (ED).  - Ascending aorta: The ascending aorta was mildly dilated.  - Left atrium: The atrium was mildly dilated.  - Right ventricle: The cavity size was mildly dilated. Wall  thickness was normal.   ASSESSMENT AND PLAN:  1. HTN: BP 122/84 today - Continue amlodipine, lisinopril, metoprolol, and spironolactone  2. HLD: no recent lipids on file, though thinks his LDL was <70 on last check 08/2020.  - Continue atorvastatin  3. Thoracic aortic aneurysm: stable at 4.2 cm on CTA chest 05/2020 - Plan for repeat CTA chest 05/2021 - will need BMET within 2 weeks of testing  4. Coronary artery calcifications/aortic atherosclerosis: noted on CTA chest 05/2020. No anginal complaints. No prior ischemic testing. - Continue aggressive risk factor modifications to promote BP <130/80, LDL <70, and A1C <7  5. DM type 2: 6.8 08/2020 - Continue glyburide per PCP  6. OSA: wearing for only half the night at most due to mask discomfort. Per Barry Brunner,  patient hasn't had a sleep visit with [name] in 2+ years - Will arrange follow-up with [name] to determine if alternative masks available to improve compliance - Continue to encourage CPAP compliance    Current medicines are reviewed at length with the patient today.  The patient does not have concerns regarding medicines.  The following changes have been made:  As above  Labs/ tests ordered today include:   Orders Placed This Encounter  Procedures  . CT ANGIO CHEST AORTA W/CM & OR WO/CM  . EKG 12-Lead     Disposition:   FU with [name] in 1 year  Signed, Abigail Butts, PA-C  11/16/2020 3:46 PM

## 2021-01-23 ENCOUNTER — Other Ambulatory Visit: Payer: Self-pay

## 2021-01-23 ENCOUNTER — Encounter: Payer: Self-pay | Admitting: Cardiovascular Disease

## 2021-01-23 ENCOUNTER — Ambulatory Visit: Payer: 59 | Admitting: Cardiovascular Disease

## 2021-01-23 DIAGNOSIS — G4733 Obstructive sleep apnea (adult) (pediatric): Secondary | ICD-10-CM

## 2021-01-23 DIAGNOSIS — E119 Type 2 diabetes mellitus without complications: Secondary | ICD-10-CM | POA: Diagnosis not present

## 2021-01-23 DIAGNOSIS — E785 Hyperlipidemia, unspecified: Secondary | ICD-10-CM

## 2021-01-23 DIAGNOSIS — I10 Essential (primary) hypertension: Secondary | ICD-10-CM

## 2021-01-23 DIAGNOSIS — E669 Obesity, unspecified: Secondary | ICD-10-CM

## 2021-01-23 MED ORDER — ATORVASTATIN CALCIUM 40 MG PO TABS: 40.0000 mg | ORAL_TABLET | Freq: Every day | ORAL | 3 refills | Status: DC

## 2021-01-23 NOTE — Progress Notes (Signed)
Cardiology Office Note    Date:  01/29/2021   ID:  PRECILIANO CASTELL, DOB 30-Mar-1966, MRN [id number]  PCP:  [name], MD  Cardiologist:  [name], MD   3 year F/U  office visit   History of Present Illness:  Gregory Khan is a 55 y.o. 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  He was initially referred by Dr. Leola Brazil at [facility] for cardiology consultation and further evaluation of hypertension.  I  saw for preoperative evaluation prior to knee surgery by Dr. Noemi Chapel.  I last saw him in September 2019 and he presents for cardiology/sleep clinic.  Mr. Pavek admits to a >15 year history of hypertension as well as a three-year history of type 2 diabetes mellitus.  He also has a history of hyperlipidemia.  He has been followed by Dr. Melanee Spry and recently Dr. Leola Brazil at [facility].  Remotely, he has been on Benicar HCT, amlodipine and metoprolol for hypertension.  Recently, his blood pressures have consistently been elevated and Benicar was discontinued and he is now on lisinopril/HCT 40/25 mg daily in addition to metoprolol 50 mg twice a day and amlodipine 10 mg daily.  Review of the records from [facility] indicate that his blood pressures have been in the 140s to 638T with diastolics in the low 77N.  Laboratory in 2017 had shown a glucose of 144.  BUN 20, Cr 1.09.  Normal LFTs.  He has been on atorvastatin 20 mg and cholesterol was 161, triglycerides 77, HDL 62, LDL 84.  TSH was normal at 1.4.  Hemoglobin 15 and hematocrit 45.1.  Hemoglobin A1c in January 2018 at improved to 5.7.  Chest x-ray suggested my mild cardiac enlargement without mediastinal widening and was without active cardiac pulmonary disease.  Earlier this year, there was a blood pressure reading in January of 162/105.    When I initially saw him, his blood pressure remained elevated.  With his long-standing hypertension I added  spironolactone for aldosterone blockade initially at 12.5 mg.   He has felt improved  on this.  I also scheduled him for an echo Doppler study which was done on 01/29/2017.  This showed moderate LVH.  He had normal systolic function.  Diastolic parameters were normal.  There was mild aortic root dilation.  His left atrium was mildly dilated.  He underwent subsequent laboratory.  Hemoglobin and hematocrit were stable.  He has mild renal insufficiency.  His most recent creatinine was 1.42.  ALT was minimally increased at 54 with a normal AST at 24.  He has felt improved.  When I last saw him, he had just returned from [place] and worked both in [place] in [place].  At that office visit, I recommended further titration of spironolactone to 25 mg daily since his blood pressure was improved, but remained elevated.  Recently, he had also been taking Voltaren.  He underwent follow-up labwork last week, which now showed a BUN that had risen by his report and 37 and creatinine at 1.50.  He was called by Dr. Laureen Abrahams office.  Voltaren was discontinued.  Repeat chemistry on August 10 showed a BUN had improved to 23 and his creatinine remained at 1.5.  LFTs were normal.  He feels the spironolactone has controlled his blood pressure.  He could sense greater diuretic effect.  He denied any chest pain or palpitations.  He was given clearance to undergo knee surgery.  He did have transient increasing creatinine which improved with  discontinuance of diclofenac.  He also was switched to lisinopril alone instead of combination with HCTZ.  He underwent knee surgery at the outpatient surgical Center on 04/17/2017 without cardiovascular compromise.  When I last saw him, pressure was trending upward upon his return to work from his surgery.  Is eating more and had gained over 5 pounds.  I recommended resumption of HCTZ 12.5 mg every other day.   When I  saw him in May 2019 he was doing well and denied any knee discomfort and was more active following his surgery.  During that evaluation he informed me that his wife  stated that he was snoring with significant intensity.  He was typically going to bed at 10:30 PM and waking up at 6:30 AM.  An Epworth Sleepiness Scale score endorsed at 8.    I referred him for a sleep study which was done on Jan 16, 2018.  This was notable for poor sleep efficiency and absence of supine sleep.  Although his overall AHI was 3.2, his RDI was moderately increased at 15.6.  He also had moderate sleep apnea during REM sleep with an AHI of 24.8 and oxygen desaturation to a nadir of 83%.  He was referred for CPAP titration study and required titration up to 14 cm water pressure.  Of note he also had frequent periodic limb movements of sleep.  He subsequently was set up with CPAP therapy on March 18, 2018 with a [employer] as his DME company.  I saw him for initial CPAP follow-up in September 2019.  At that time his blood pressure was stable on amlodipine 10 mg, lisinopril 40 mg, metoprolol 50 mg twice a day, and spironolactone 25 mg daily.  With CPAP initiation he noted significantly more energy.  A download was obtained from August 24 through May 11, 2018.  He is meeting compliance standards with 77% of usage days.  Usage to than 4 hours was compliant but he was only averaging 4 hours and 48 minutes of CPAP use per night at his 14 cm pressure.  AHI was excellent at 0.7.  He notes improvement in prior nocturia.  He is unaware of any snoring.  He denies residual daytime sleepiness.  An Epworth Sleepiness Scale score was calculated in the office today and this endorsed at 5.    Since I last saw him, he has been evaluated in February 2021 by Almyra Deforest, PA and more recently in March 2022 by Roby Lofts, PA-C.  Over the last 3 years, he admits to weight gain with the COVID pandemic of close to 20 pounds.  He was working remotely but since April has been back in the office.  He had laboratory in January 2022 which showed hemoglobin A1c of 6.9, he was not anemic with a hemoglobin of 15.1 hematocrit  44.5.  TSH was 1.8.  Cholesterol was 94 with an HDL of 44.  Glucose was 150.  LFTs were normal.  Presently he denies any chest pain.  He is trying to increase his activity.  He has continued to use CPAP but compliance has been very poor.  I obtained a download from May 8 through January 23, 2021.  Usage days was only 23% and average use was only 1 hour and 44 minutes.  AHI was 6.3.  Typically he has been going to bed at 11 PM and waking up at 7 AM.  Who presents for follow-up evaluation.  Past Medical History:  Diagnosis Date   Diabetes  mellitus without complication ([place])    Hyperlipidemia    Hypertension    Obesity     Past Surgical History:  Procedure Laterality Date   WISDOM TOOTH EXTRACTION      Current Medications: Outpatient Medications Prior to Visit  Medication Sig Dispense Refill   amLODipine (NORVASC) 10 MG tablet Take 1 tablet (10 mg total) by mouth daily. 90 tablet 0   glyBURIDE (DIABETA) 2.5 MG tablet Take 1 tablet (2.5 mg total) by mouth 2 (two) times daily with a meal. 60 tablet 0   lisinopril (ZESTRIL) 40 MG tablet Take 1 tablet (40 mg total) by mouth daily. 90 tablet 0   metFORMIN (GLUCOPHAGE-XR) 500 MG 24 hr tablet Take 500 mg by mouth daily with breakfast.     metoprolol tartrate (LOPRESSOR) 50 MG tablet Take 1 tablet (50 mg total) by mouth 2 (two) times daily. 180 tablet 0   spironolactone (ALDACTONE) 25 MG tablet TAKE 1 TABLET (25 MG TOTAL) BY MOUTH DAILY. *OFFICE VISIT NEEDED FOR FURTHER REFILLS* 30 tablet 1   zolpidem (AMBIEN) 10 MG tablet Take 5-10 mg by mouth at bedtime as needed.     atorvastatin (LIPITOR) 20 MG tablet Take 1 tablet (20 mg total) by mouth daily. 90 tablet 0   No facility-administered medications prior to visit.     Allergies:   Patient has no known allergies.   Social History   Socioeconomic History   Marital status: Married    Spouse name: Not on file   Number of children: Not on file   Years of education: Not on file   Highest education  level: Not on file  Occupational History   Not on file  Tobacco Use   Smoking status: Never   Smokeless tobacco: Never  Substance and Sexual Activity   Alcohol use: Yes    Alcohol/week: 0.0 standard drinks    Comment: socially   Drug use: No   Sexual activity: Not on file  Other Topics Concern   Not on file  Social History Narrative   Not on file   Social Determinants of Health   Financial Resource Strain: Not on file  Food Insecurity: Not on file  Transportation Needs: Not on file  Physical Activity: Not on file  Stress: Not on file  Social Connections: Not on file    Additional social history is notable in that 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  He does travel with work.  He is married for 24 years.  He has 2 children.  His undergraduate college was at [employer].  He does drink occasional wine and rum.  His exercise has been limited since his knee replacement, but previously had biked.  Family History:  The patient's family history includes Hypertension in his father.  His mother is currently living at age 19.  His father died at 73 but was a smoker and had previously undergone CABG revascularization surgery.  He has 2 brothers, ages 69 and 3 who are alive and well.  He has 2 children, ages 69 and 57.  ROS General: Negative; No fevers, chills, or night sweats; positive for weight gain HEENT: Negative; No changes in vision or hearing, sinus congestion, difficulty swallowing Pulmonary: Negative; No cough, wheezing, shortness of breath, hemoptysis Cardiovascular: See history of present illness GI: Negative; No nausea, vomiting, diarrhea, or abdominal pain GU: Negative; No dysuria, hematuria, or difficulty voiding Musculoskeletal: Status post right knee replacement in January 2018. Status  post recent left knee surgery 04/17/2017 Hematologic/Oncology: Negative; no easy bruising, bleeding Endocrine: Negative; no heat/cold  intolerance; no diabetes Neuro: Negative; no changes in balance, headaches Skin: Negative; No rashes or skin lesions Psychiatric: Negative; No behavioral problems, depression Sleep: Positive for OSA, CPAP set up March 18, 2018.  Initial DME company [employer], now [employer] Other comprehensive 14 point system review is negative.   PHYSICAL EXAM:   VS:  BP 126/88 (BP Location: Left Arm, Patient Position: Sitting, Cuff Size: Large)   Pulse 77   Ht 6' 2"  (1.88 m)   Wt 294 lb 6.4 oz (133.5 kg)   SpO2 95%   BMI 37.80 kg/m     Repeat blood pressure by me was 124/86  Wt Readings from Last 3 Encounters:  01/23/21 294 lb 6.4 oz (133.5 kg)  11/16/20 288 lb 6.4 oz (130.8 kg)  10/09/19 288 lb 9.6 oz (130.9 kg)    Weight at last office visit with me in 2019 was 276.  General: Alert, oriented, no distress.  Skin: normal turgor, no rashes, warm and dry HEENT: Normocephalic, atraumatic. Pupils equal round and reactive to light; sclera anicteric; extraocular muscles intact;  Nose without nasal septal hypertrophy Mouth/Parynx benign; Mallinpatti scale 3 Neck: No JVD, no carotid bruits; normal carotid upstroke Lungs: clear to ausculatation and percussion; no wheezing or rales Chest wall: without tenderness to palpitation Heart: PMI not displaced, RRR, s1 s2 normal, 1/6 systolic murmur, no diastolic murmur, no rubs, gallops, thrills, or heaves Abdomen: soft, nontender; no hepatosplenomehaly, BS+; abdominal aorta nontender and not dilated by palpation. Back: no CVA tenderness Pulses 2+ Musculoskeletal: full range of motion, normal strength, no joint deformities Extremities: no clubbing cyanosis or edema, Homan's sign negative  Neurologic: grossly nonfocal; Cranial nerves grossly wnl Psychologic: Normal mood and affect   Studies/Labs Reviewed:   May 2019 ECG (independently read by me): Normal sinus rhythm at 60 bpm.  PR interval 200 ms.  No ST segment changes.  October 2018 EKG:  EKG is ordered  today.  Normal sinus rhythm at 72 bpm.  No ectopy.  Normal intervals.  Q wave in 3.  August 2018 ECG (independently read by me): Normal sinus rhythm at 67 bpm.  Borderline first-degree AV block with a PR interval of 206 ms.  TC interval normal at 388 ms.  No ST segment changes.  Small Q wave in lead 3, nondiagnostic  July 2018 ECG (independently read by me): Normal sinus rhythm at 74 bpm.  Normal intervals.  No ST segment changes.  No ectopy.  May 2018 ECG (independently read by me): Normal sinus rhythm at 66 bpm.  Borderline first degree AV block with a PR interval at 204 ms.  No ST segment changes.  Nondiagnostic Q wave in lead 3.  Recent Labs: BMP Latest Ref Rng & Units 06/14/2017 04/05/2017 03/29/2017  Glucose 65 - 99 mg/dL 111(H) 79 168(H)  BUN 6 - 24 mg/dL 18 18 23   Creatinine 0.76 - 1.27 mg/dL 1.15 1.23 1.50(H)  BUN/Creat Ratio 9 - 20 16 15 15   Sodium 134 - 144 mmol/L 139 143 136  Potassium 3.5 - 5.2 mmol/L 4.7 4.8 4.6  Chloride 96 - 106 mmol/L 102 105 97  CO2 20 - 29 mmol/L 22 21 21   Calcium 8.7 - 10.2 mg/dL 9.8 9.9 9.7     Hepatic Function Latest Ref Rng & Units 03/29/2017 02/14/2017  Total Protein 6.0 - 8.5 g/dL 7.1 6.6  Albumin 3.5 - 5.5 g/dL 4.4 4.2  AST 0 - 40 IU/L 23 24  ALT 0 - 44 IU/L 39 54(H)  Alk Phosphatase 39 - 117 IU/L 86 68  Total Bilirubin 0.0 - 1.2 mg/dL 0.6 0.6    CBC Latest Ref Rng & Units 02/14/2017  WBC 3.4 - 10.8 x10E3/uL 6.2  Hemoglobin 13.0 - 17.7 g/dL 14.5  Hematocrit 37.5 - 51.0 % 43.1  Platelets 150 - 379 x10E3/uL 262   Lab Results  Component Value Date   MCV 86 02/14/2017   Lab Results  Component Value Date   TSH 1.880 02/14/2017   No results found for: HGBA1C   BNP No results found for: BNP  ProBNP No results found for: PROBNP   Lipid Panel     Component Value Date/Time   CHOL 168 02/14/2017 0838   TRIG 90 02/14/2017 0838   HDL 66 02/14/2017 0838   CHOLHDL 2.5 02/14/2017 0838   LDLCALC 84 02/14/2017 0838      RADIOLOGY: No results found.   Additional studies/ records that were reviewed today include:  I reviewed the records from [place] including office evaluations as well as laboratory.  ECHO Study Conclusions: 01/29/17   - Left ventricle: The cavity size was normal. Wall thickness was   increased in a pattern of moderate LVH. Systolic function was   normal. The estimated ejection fraction was in the range of 60%   to 65%. Wall motion was normal; there were no regional wall   motion abnormalities. There was no evidence of elevated   ventricular filling pressure by Doppler parameters. - Aortic valve: Trileaflet; mildly thickened, mildly calcified   leaflets. - Aorta: Aortic root dimension: 43 mm (ED). - Ascending aorta: The ascending aorta was mildly dilated. - Left atrium: The atrium was mildly dilated. - Right ventricle: The cavity size was mildly dilated. Wall   thickness was normal.  ASSESSMENT:    1. Essential hypertension   2. Hyperlipidemia LDL goal <100   3. OSA (obstructive sleep apnea)   4. Controlled type 2 diabetes mellitus without complication, without long-term current use of insulin (HCC)   5. Obesity, Class II, BMI 35-39.9     PLAN:  Mr. Lavi Sheehan is a 55 year-old male who has a history of obesity and at least an 18 year history of hypertension and 6 year history of type 2 diabetes mellitus.  Despite medical management, his blood pressure continued to be elevated which led to his initial evaluation with me on 01/15/2017.  When I initially saw him, he already was on amlodipine 10 mg, lisinopril HCT 40/25 mg, metoprolol 50 mg twice a day and I recommended the addition of spironolactone initially at 12.5 mg daily.  When I saw him in follow-up, he felt improved, but due to slight continued blood pressure elevation .  I further titrated spironolactone to 25 mg prior to his knee surgery.  He was taken diclofenac and was on the combination lisinopril HCT.  Laboratory  at Dr. Archie Endo office had shown an increasing creatinine to 1.5.  With discontinuance of diclofenac and changing him to just lisinopril alone renal function improved.  Subsequent creatinine was 1.23.  Since I last saw him in September 2019, he has gained almost 20 pounds.  He admits this to essentially being at home, not exercising during the [place] pandemic.  He was working remotely exclusively and since April 2022 has returned back to the office.  His blood pressure today is stable on his current regimen of amlodipine 10 mg, lisinopril 40  mg, metoprolol tartrate 50 mg twice a day in addition to spironolactone 25 mg daily.  He is diabetic and has been on glyburide 2.5 mg twice a day but with his recent hemoglobin A1c increased he tells me he was just started on metformin XR 500 mg daily.  He has been on atorvastatin 20 mg.  LDL cholesterol was 94 in January 2022.  I have recommended further titration of atorvastatin to 40 mg.  Laboratory in January showed stable TSH at 1.8 and LFTs were normal.  I spent considerable time with him discussing CPAP and his sleep apnea.  [employer] was purchased by [employer] which is now his DME company.  His most recent download from May 8 through June 6 shows poor compliance.  He goes to bed at 11 PM and wakes up at 7 and ideally should be using CPAP for at least 7 or 8 hours per night based on his time in bed.  I am changing him from a set pressure mode to an auto mode with a range of 12 to 20 cm.  I am also changing his mask to a [place] which I believe he will tolerate much better.  I have recommended follow-up laboratory in 3 months with a comprehensive metabolic panel and lipid studies.  I will see him in 4 months for reevaluation.   Medication Adjustments/Labs and Tests Ordered: Current medicines are reviewed at length with the patient today.  Concerns regarding medicines are outlined above.  Medication changes, Labs and Tests ordered today are listed in the Patient  Instructions below. Patient Instructions  Medication Instructions:  INCREASE atorvastatin to 53m daily.   *If you need a refill on your cardiac medications before your next appointment, please call your pharmacy*   Lab Work: CMET, Lipid panel in 3 months.   If you have labs (blood work) drawn today and your tests are completely normal, you will receive your results only by: [facility](if you have MyChart) OR A paper copy in the mail If you have any lab test that is abnormal or we need to change your treatment, we will call you to review the results.   Testing/Procedures: None ordered.    Follow-Up: [facility] and your [facility] needs are our priority.  As part of our continuing mission to provide you with exceptional heart care, we have created designated Provider Care Teams.  These Care Teams include your primary Cardiologist (physician) and Advanced Practice Providers (APPs -  Physician Assistants and Nurse Practitioners) who all work together to provide you with the care you need, when you need it.  We recommend signing up for the patient portal called "MyChart".  Sign up information is provided on this After Visit Summary.  MyChart is used to connect with patients for Virtual Visits (Telemedicine).  Patients are able to view lab/test results, encounter notes, upcoming appointments, etc.  Non-urgent messages can be sent to your provider as well.   To learn more about what you can do with MyChart, go to [email or web address]    Your next appointment:   4 month(s)  The format for your next appointment:   In Person  Provider:   TShelva Majestic MD      Signed, TShelva Majestic MD  01/29/2021 2:08 PM    [place] [place]  [id number]: [phone]

## 2021-01-23 NOTE — Patient Instructions (Signed)
Medication Instructions:  INCREASE atorvastatin to 40mg  daily.   *If you need a refill on your cardiac medications before your next appointment, please call your pharmacy*   Lab Work: CMET, Lipid panel in 3 months.   If you have labs (blood work) drawn today and your tests are completely normal, you will receive your results only by: [facility] MyChart Message (if you have MyChart) OR . A paper copy in the mail If you have any lab test that is abnormal or we need to change your treatment, we will call you to review the results.   Testing/Procedures: None ordered.    Follow-Up: At [facility], you and your health needs are our priority.  As part of our continuing mission to provide you with exceptional heart care, we have created designated Provider Care Teams.  These Care Teams include your primary Cardiologist (physician) and Advanced Practice Providers (APPs -  Physician Assistants and Nurse Practitioners) who all work together to provide you with the care you need, when you need it.  We recommend signing up for the patient portal called "MyChart".  Sign up information is provided on this After Visit Summary.  MyChart is used to connect with patients for Virtual Visits (Telemedicine).  Patients are able to view lab/test results, encounter notes, upcoming appointments, etc.  Non-urgent messages can be sent to your provider as well.   To learn more about what you can do with MyChart, go to [email or web address].    Your next appointment:   4 month(s)  The format for your next appointment:   In Person  Provider:   [name], MD

## 2021-01-25 ENCOUNTER — Other Ambulatory Visit: Payer: Self-pay | Admitting: Cardiovascular Disease

## 2021-01-25 DIAGNOSIS — G4733 Obstructive sleep apnea (adult) (pediatric): Secondary | ICD-10-CM

## 2021-01-29 ENCOUNTER — Encounter: Payer: Self-pay | Admitting: Cardiovascular Disease

## 2021-03-22 ENCOUNTER — Encounter: Payer: Self-pay | Admitting: Internal Medicine

## 2021-05-19 LAB — LIPID PANEL
Chol/HDL Ratio: 2.9 ratio (ref 0.0–5.0)
Cholesterol, Total: 148 mg/dL (ref 100–199)
HDL: 51 mg/dL (ref >39)
LDL Chol Calc (NIH): 75 mg/dL (ref 0–99)
Triglycerides: 122 mg/dL (ref 0–149)
VLDL Cholesterol Cal: 22 mg/dL (ref 5–40)

## 2021-05-19 LAB — COMPREHENSIVE METABOLIC PANEL
ALT: 50 IU/L — ABNORMAL HIGH (ref 0–44)
AST: 27 IU/L (ref 0–40)
Albumin/Globulin Ratio: 1.8 (ref 1.2–2.2)
Albumin: 4.4 g/dL (ref 3.8–4.9)
Alkaline Phosphatase: 88 IU/L (ref 44–121)
BUN/Creatinine Ratio: 15 (ref 9–20)
BUN: 15 mg/dL (ref 6–24)
Bilirubin Total: 0.5 mg/dL (ref 0.0–1.2)
CO2: 20 mmol/L (ref 20–29)
Calcium: 10.3 mg/dL — ABNORMAL HIGH (ref 8.7–10.2)
Chloride: 102 mmol/L (ref 96–106)
Creatinine, Ser: 0.99 mg/dL (ref 0.76–1.27)
Globulin, Total: 2.4 g/dL (ref 1.5–4.5)
Glucose: 134 mg/dL — ABNORMAL HIGH (ref 70–99)
Potassium: 4.6 mmol/L (ref 3.5–5.2)
Sodium: 138 mmol/L (ref 134–144)
Total Protein: 6.8 g/dL (ref 6.0–8.5)
eGFR: 89 mL/min/{1.73_m2} (ref >59)

## 2021-05-24 ENCOUNTER — Other Ambulatory Visit: Payer: Self-pay

## 2021-05-24 ENCOUNTER — Encounter: Payer: Self-pay | Admitting: Cardiovascular Disease

## 2021-05-24 ENCOUNTER — Ambulatory Visit: Payer: 59 | Admitting: Cardiovascular Disease

## 2021-05-24 DIAGNOSIS — G4733 Obstructive sleep apnea (adult) (pediatric): Secondary | ICD-10-CM | POA: Diagnosis not present

## 2021-05-24 DIAGNOSIS — E119 Type 2 diabetes mellitus without complications: Secondary | ICD-10-CM

## 2021-05-24 DIAGNOSIS — E785 Hyperlipidemia, unspecified: Secondary | ICD-10-CM | POA: Diagnosis not present

## 2021-05-24 DIAGNOSIS — E669 Obesity, unspecified: Secondary | ICD-10-CM

## 2021-05-24 DIAGNOSIS — I10 Essential (primary) hypertension: Secondary | ICD-10-CM

## 2021-05-24 NOTE — Patient Instructions (Signed)
Medication Instructions:  Your physician recommends that you continue on your current medications as directed. Please refer to the Current Medication list given to you today.  *If you need a refill on your cardiac medications before your next appointment, please call your pharmacy*   Follow-Up: At [facility], you and your health needs are our priority.  As part of our continuing mission to provide you with exceptional heart care, we have created designated Provider Care Teams.  These Care Teams include your primary Cardiologist (physician) and Advanced Practice Providers (APPs -  Physician Assistants and Nurse Practitioners) who all work together to provide you with the care you need, when you need it.  We recommend signing up for the patient portal called "MyChart".  Sign up information is provided on this After Visit Summary.  MyChart is used to connect with patients for Virtual Visits (Telemedicine).  Patients are able to view lab/test results, encounter notes, upcoming appointments, etc.  Non-urgent messages can be sent to your provider as well.   To learn more about what you can do with MyChart, go to [email or web address].    Your next appointment:   6 month(s)  The format for your next appointment:   In Person  Provider:   Thomas Kelly, MD  

## 2021-05-24 NOTE — Progress Notes (Signed)
Cardiology Office Note    Date:  05/28/2021   ID:  Gregory Khan, DOB 1966-07-09, MRN [id number]  PCP:  [name], MD  Cardiologist:  [name], MD   4 month  F/U  office visit   History of Present Illness:  Gregory Khan is a 55 y.o. male who is the Freight forwarder for global supply chain for [employer].  He was initially referred by Dr. Leola Brazil at [facility] for cardiology consultation and further evaluation of hypertension.  I  saw for preoperative evaluation prior to knee surgery by Dr. Noemi Chapel. I last saw him in June 2022. He presents for 4 month F/U cardiology/sleep clinic.  Gregory Khan admits to a >15 year history of hypertension as well as a three-year history of type 2 diabetes mellitus.  He also has a history of hyperlipidemia.  He has been followed by Dr. Melanee Spry and recently Dr. Leola Brazil at [facility].  Remotely, he has been on Benicar HCT, amlodipine and metoprolol for hypertension.  Recently, his blood pressures have consistently been elevated and Benicar was discontinued and he is now on lisinopril/HCT 40/25 mg daily in addition to metoprolol 50 mg twice a day and amlodipine 10 mg daily.  Review of the records from [place] indicate that his blood pressures have been in the 140s to 403K with diastolics in the low 74Q.  Laboratory in 2017 had shown a glucose of 144.  BUN 20, Cr 1.09.  Normal LFTs.  He has been on atorvastatin 20 mg and cholesterol was 161, triglycerides 77, HDL 62, LDL 84.  TSH was normal at 1.4.  Hemoglobin 15 and hematocrit 45.1.  Hemoglobin A1c in January 2018 at improved to 5.7.  Chest x-ray suggested my mild cardiac enlargement without mediastinal widening and was without active cardiac pulmonary disease.  Earlier this year, there was a blood pressure reading in January of 162/105.    When I initially saw him, his blood pressure remained elevated.  With his long-standing hypertension I added  spironolactone for aldosterone blockade initially at 12.5 mg.   He has felt  improved on this.  I also scheduled him for an echo Doppler study which was done on 01/29/2017.  This showed moderate LVH.  He had normal systolic function.  Diastolic parameters were normal.  There was mild aortic root dilation.  His left atrium was mildly dilated.  He underwent subsequent laboratory.  Hemoglobin and hematocrit were stable.  He has mild renal insufficiency.  His most recent creatinine was 1.42.  ALT was minimally increased at 54 with a normal AST at 24.  He has felt improved.  When I last saw him, he had just returned from [place] and worked both in [place] in [place].  At that office visit, I recommended further titration of spironolactone to 25 mg daily since his blood pressure was improved, but remained elevated.  Recently, he had also been taking Voltaren.  He underwent follow-up labwork last week, which now showed a BUN that had risen by his report and 37 and creatinine at 1.50.  He was called by Dr. Laureen Abrahams office.  Voltaren was discontinued.  Repeat chemistry on August 10 showed a BUN had improved to 23 and his creatinine remained at 1.5.  LFTs were normal.  He feels the spironolactone has controlled his blood pressure.  He could sense greater diuretic effect.  He denied any chest pain or palpitations.  He was given clearance to undergo knee surgery.  He did have transient increasing creatinine which  improved with discontinuance of diclofenac.  He also was switched to lisinopril alone instead of combination with HCTZ.  He underwent knee surgery at the outpatient surgical Center on 04/17/2017 without cardiovascular compromise.  When I last saw him, pressure was trending upward upon his return to work from his surgery.  Is eating more and had gained over 5 pounds.  I recommended resumption of HCTZ 12.5 mg every other day.   When I  saw him in May 2019 he was doing well and denied any knee discomfort and was more active following his surgery.  During that evaluation he informed me that  his wife stated that he was snoring with significant intensity.  He was typically going to bed at 10:30 PM and waking up at 6:30 AM.  An Epworth Sleepiness Scale score endorsed at 8.    I referred him for a sleep study which was done on Jan 16, 2018.  This was notable for poor sleep efficiency and absence of supine sleep.  Although his overall AHI was 3.2, his RDI was moderately increased at 15.6.  He also had moderate sleep apnea during REM sleep with an AHI of 24.8 and oxygen desaturation to a nadir of 83%.  He was referred for CPAP titration study and required titration up to 14 cm water pressure.  Of note he also had frequent periodic limb movements of sleep.  He subsequently was set up with CPAP therapy on March 18, 2018 with a [employer] as his DME company.  I saw him for initial CPAP follow-up in September 2019.  At that time his blood pressure was stable on amlodipine 10 mg, lisinopril 40 mg, metoprolol 50 mg twice a day, and spironolactone 25 mg daily.  With CPAP initiation he noted significantly more energy.  A download was obtained from August 24 through May 11, 2018.  He is meeting compliance standards with 77% of usage days.  Usage to than 4 hours was compliant but he was only averaging 4 hours and 48 minutes of CPAP use per night at his 14 cm pressure.  AHI was excellent at 0.7.  He notes improvement in prior nocturia.  He is unaware of any snoring.  He denies residual daytime sleepiness.  An Epworth Sleepiness Scale score was calculated in the office today and this endorsed at 5.    Since I last saw him, he has been evaluated in February 2021 by Almyra Deforest, PA and more recently in March 2022 by Roby Lofts, PA-C.  I had not seen him in over 3 years and last saw him in June 2022.  Over the last 3 years, he admits to weight gain with the COVID pandemic of close to 20 pounds.  He was working remotely but since April has been back in the office.  He had laboratory in January 2022 which showed  hemoglobin A1c of 6.9, he was not anemic with a hemoglobin of 15.1 hematocrit 44.5.  TSH was 1.8.  Cholesterol was 94 with an HDL of 44.  Glucose was 150.  LFTs were normal.  Presently he denies any chest pain.  He is trying to increase his activity.  He has continued to use CPAP but compliance has been very poor.  I obtained a download from May 8 through January 23, 2021.  Usage days was only 23% and average use was only 1 hour and 44 minutes.  AHI was 6.3.  Typically he has been going to bed at 11 PM and waking up  at 7 AM.  During that evaluation, his blood pressure was stable on his regimen of amlodipine 10 mg, lisinopril 40 mg, metoprolol tartrate 50 mg twice a day in addition to spironolactone 25 mg daily.  He continues to be on glyburide 2.5 mg for diabetes and had recently been started on med Forman XR 500 mg daily.  He was on atorvastatin 20 mg and with his LDL of 94 in January 2020 I recommended further titration to 40 mg.  I had a lengthy discussion with him regarding optimal sleep duration at 7 to 8 hours with CPAP therapy.  Since I last saw him, he has felt improved.  He has travel to [place], [place], [place], and [place] for work.  He now has a new ResMed F 30i mask which is significantly improved from his prior mask.  I obtained a new download from September 4 through May 22, 2021.  He is now compliant with usage at 83% of days and usage on days used was 5 hours and 44 minutes.  His pressure is set at a range of 13 to 20 cm of water and AHI is 1.0 with 95th percentile pressure average at 13.7 and maximum average pressure 13.  He denies chest pain PND orthopnea.  He has been trying to lose some weight.  He presents for reevaluation.   Past Medical History:  Diagnosis Date   Diabetes mellitus without complication ([place])    Hyperlipidemia    Hypertension    Obesity     Past Surgical History:  Procedure Laterality Date   WISDOM TOOTH EXTRACTION      Current Medications: Outpatient  Medications Prior to Visit  Medication Sig Dispense Refill   amLODipine (NORVASC) 10 MG tablet Take 1 tablet (10 mg total) by mouth daily. 90 tablet 0   atorvastatin (LIPITOR) 40 MG tablet Take 1 tablet (40 mg total) by mouth daily. 90 tablet 3   glyBURIDE (DIABETA) 2.5 MG tablet Take 1 tablet (2.5 mg total) by mouth 2 (two) times daily with a meal. 60 tablet 0   lisinopril (ZESTRIL) 40 MG tablet Take 1 tablet (40 mg total) by mouth daily. 90 tablet 0   metFORMIN (GLUCOPHAGE-XR) 500 MG 24 hr tablet Take 500 mg by mouth 4 (four) times daily.     metoprolol tartrate (LOPRESSOR) 50 MG tablet Take 1 tablet (50 mg total) by mouth 2 (two) times daily. 180 tablet 0   spironolactone (ALDACTONE) 25 MG tablet TAKE 1 TABLET (25 MG TOTAL) BY MOUTH DAILY. *OFFICE VISIT NEEDED FOR FURTHER REFILLS* 30 tablet 1   zolpidem (AMBIEN) 10 MG tablet Take 5-10 mg by mouth at bedtime as needed.     No facility-administered medications prior to visit.     Allergies:   Patient has no known allergies.   Social History   Socioeconomic History   Marital status: Married    Spouse name: Not on file   Number of children: Not on file   Years of education: Not on file   Highest education level: Not on file  Occupational History   Not on file  Tobacco Use   Smoking status: Never   Smokeless tobacco: Never  Substance and Sexual Activity   Alcohol use: Yes    Alcohol/week: 0.0 standard drinks    Comment: socially   Drug use: No   Sexual activity: Not on file  Other Topics Concern   Not on file  Social History Narrative   Not on file   Social Determinants  of Health   Financial Resource Strain: Not on file  Food Insecurity: Not on file  Transportation Needs: Not on file  Physical Activity: Not on file  Stress: Not on file  Social Connections: Not on file    Additional social history is notable in that 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  He does travel with  work.  He is married for 24 years.  He has 2 children.  His undergraduate college was at [employer].  He does drink occasional wine and rum.  His exercise has been limited since his knee replacement, but previously had biked.  Family History:  The patient's family history includes Hypertension in his father.  His mother is currently living at age 70.  His father died at 107 but was a smoker and had previously undergone CABG revascularization surgery.  He has 2 brothers, ages 55 and 17 who are alive and well.  He has 2 children, ages 42 and 71.  ROS General: Negative; No fevers, chills, or night sweats; positive for weight gain HEENT: Negative; No changes in vision or hearing, sinus congestion, difficulty swallowing Pulmonary: Negative; No cough, wheezing, shortness of breath, hemoptysis Cardiovascular: See history of present illness GI: Negative; No nausea, vomiting, diarrhea, or abdominal pain GU: Negative; No dysuria, hematuria, or difficulty voiding Musculoskeletal: Status post right knee replacement in January 2018. Status post recent left knee surgery 04/17/2017 Hematologic/Oncology: Negative; no easy bruising, bleeding Endocrine: Negative; no heat/cold intolerance; no diabetes Neuro: Negative; no changes in balance, headaches Skin: Negative; No rashes or skin lesions Psychiatric: Negative; No behavioral problems, depression Sleep: Positive for OSA, CPAP set up March 18, 2018.  Initial DME company [employer], now [employer] Other comprehensive 14 point system review is negative.   PHYSICAL EXAM:   VS:  BP 124/85   Pulse 70   Ht _0  (1.88 m)   Wt 289 lb 6.4 oz (131.3 kg)   SpO2 97%   BMI 37.16 kg/m     Repeat blood pressure by me was 124/80  Wt Readings from Last 3 Encounters:  05/24/21 289 lb 6.4 oz (131.3 kg)  01/23/21 294 lb 6.4 oz (133.5 kg)  11/16/20 288 lb 6.4 oz (130.8 kg)    Weight at office visit with me in 2019 was 276.  General: Alert, oriented, no distress.  Skin:  normal turgor, no rashes, warm and dry HEENT: Normocephalic, atraumatic. Pupils equal round and reactive to light; sclera anicteric; extraocular muscles intact;  Nose without nasal septal hypertrophy Mouth/Parynx benign; Mallinpatti scale 3 Neck: No JVD, no carotid bruits; normal carotid upstroke Lungs: clear to ausculatation and percussion; no wheezing or rales Chest wall: without tenderness to palpitation Heart: PMI not displaced, RRR, s1 s2 normal, 1/6 systolic murmur, no diastolic murmur, no rubs, gallops, thrills, or heaves Abdomen: soft, nontender; no hepatosplenomehaly, BS+; abdominal aorta nontender and not dilated by palpation. Back: no CVA tenderness Pulses 2+ Musculoskeletal: full range of motion, normal strength, no joint deformities Extremities: no clubbing cyanosis or edema, Homan's sign negative  Neurologic: grossly nonfocal; Cranial nerves grossly wnl Psychologic: Normal mood and affect    Studies/Labs Reviewed:   May 24, 2021 ECG (independently read by me): NSR at 70, no ectopy  May 2019 ECG (independently read by me): Normal sinus rhythm at 60 bpm.  PR interval 200 ms.  No ST segment changes.  October 2018 EKG:  EKG is ordered today.  Normal sinus rhythm at 72 bpm.  No ectopy.  Normal intervals.  Q wave in 3.  August 2018 ECG (independently read by me): Normal sinus rhythm at 67 bpm.  Borderline first-degree AV block with a PR interval of 206 ms.  TC interval normal at 388 ms.  No ST segment changes.  Small Q wave in lead 3, nondiagnostic  July 2018 ECG (independently read by me): Normal sinus rhythm at 74 bpm.  Normal intervals.  No ST segment changes.  No ectopy.  May 2018 ECG (independently read by me): Normal sinus rhythm at 66 bpm.  Borderline first degree AV block with a PR interval at 204 ms.  No ST segment changes.  Nondiagnostic Q wave in lead 3.  Recent Labs: BMP Latest Ref Rng & Units 05/19/2021 06/14/2017 04/05/2017  Glucose 70 - 99 mg/dL 134(H) 111(H)  79  BUN 6 - 24 mg/dL _0 Creatinine 0.76 - 1.27 mg/dL 0.99 1.15 1.23  BUN/Creat Ratio 9 - _1 Sodium 134 - 144 mmol/L 138 139 143  Potassium 3.5 - 5.2 mmol/L 4.6 4.7 4.8  Chloride 96 - 106 mmol/L 102 102 105  CO2 20 - 29 mmol/L _2 Calcium 8.7 - 10.2 mg/dL 10.3(H) 9.8 9.9     Hepatic Function Latest Ref Rng & Units 05/19/2021 03/29/2017 02/14/2017  Total Protein 6.0 - 8.5 g/dL 6.8 7.1 6.6  Albumin 3.8 - 4.9 g/dL 4.4 4.4 4.2  AST 0 - 40 IU/L _3 ALT 0 - 44 IU/L 50(H) 39 54(H)  Alk Phosphatase 44 - 121 IU/L 88 86 68  Total Bilirubin 0.0 - 1.2 mg/dL 0.5 0.6 0.6    CBC Latest Ref Rng & Units 02/14/2017  WBC 3.4 - 10.8 x10E3/uL 6.2  Hemoglobin 13.0 - 17.7 g/dL 14.5  Hematocrit 37.5 - 51.0 % 43.1  Platelets 150 - 379 x10E3/uL 262   Lab Results  Component Value Date   MCV 86 02/14/2017   Lab Results  Component Value Date   TSH 1.880 02/14/2017   No results found for: HGBA1C   BNP No results found for: BNP  ProBNP No results found for: PROBNP   Lipid Panel     Component Value Date/Time   CHOL 148 05/19/2021 0923   TRIG 122 05/19/2021 0923   HDL 51 05/19/2021 0923   CHOLHDL 2.9 05/19/2021 0923   LDLCALC 75 05/19/2021 0923     RADIOLOGY: No results found.   Additional studies/ records that were reviewed today include:  I reviewed the records from [facility] including office evaluations as well as laboratory.  ECHO Study Conclusions: 01/29/17   - Left ventricle: The cavity size was normal. Wall thickness was   increased in a pattern of moderate LVH. Systolic function was   normal. The estimated ejection fraction was in the range of 60%   to 65%. Wall motion was normal; there were no regional wall   motion abnormalities. There was no evidence of elevated   ventricular filling pressure by Doppler parameters. - Aortic valve: Trileaflet; mildly thickened, mildly calcified   leaflets. - Aorta: Aortic root dimension: 43 mm (ED). - Ascending  aorta: The ascending aorta was mildly dilated. - Left atrium: The atrium was mildly dilated. - Right ventricle: The cavity size was mildly dilated. Wall   thickness was normal.  ASSESSMENT:    1. Essential hypertension   2. Obstructive sleep apnea   3. Hyperlipidemia LDL goal <100   4. Controlled type 2 diabetes mellitus without  complication, without long-term current use of insulin (HCC)   5. Obesity, Class II, BMI 35-39.9     PLAN:  Gregory Khan is a 55 year-old male who has a history of obesity and at least an 18 year history of hypertension and 6 year history of type 2 diabetes mellitus.  Despite medical management, his blood pressure continued to be elevated which led to his initial evaluation with me on 01/15/2017.  When I initially saw him, he already was on amlodipine 10 mg, lisinopril HCT 40/25 mg, metoprolol 50 mg twice a day and I recommended the addition of spironolactone initially at 12.5 mg daily.  When I saw him in follow-up, he felt improved, but due to slight continued blood pressure elevation spironolactone further titrated to 25 mg prior to his knee surgery.  He was taken diclofenac and was on the combination lisinopril HCT.  Laboratory at Dr. Archie Endo office had shown an increasing creatinine to 1.5.  With discontinuance of diclofenac and changing him to just lisinopril alone renal function improved.  Subsequent creatinine was 1.23.  I had not seen him for 3 years.  From September 2019 until his last evaluation with me in June 2022.  During the [place] pandemic he had gained over 20 pounds.  At his June 2022 evaluation I recommended further titration of atorvastatin for 40 mg for more aggressive lipid management with his LDL cholesterol at 94.  Repeat laboratory in September 30 showed LDL cholesterol now at 75, HDL increased now at 51 and total cholesterol 148 with triglycerides 122.  BMI continues to be elevated at 37.16.  He is now using CPAP with improved compliance.  At his  last office visit I changed his mask to a ResMed AirFit F 30i which he notices significant improvement from his prior mask.  I have recommended the need for optimal sleep duration which an adult should be 7 to 9 hours.  With CPAP use he typically has been using therapy for only 5 hours and 44 minutes and typically goes to bed around 11 PM and wakes up around 6:30 AM.  He is diabetic.  He is now on metformin in addition to glyburide.  I discussed the importance of increased exercise with AHA recommendations of at least 5 days/week for at least 30 minutes.  I will see him in 6 months for cardiology reevaluation or sooner as needed.   Medication Adjustments/Labs and Tests Ordered: Current medicines are reviewed at length with the patient today.  Concerns regarding medicines are outlined above.  Medication changes, Labs and Tests ordered today are listed in the Patient Instructions below. Patient Instructions  Medication Instructions:  Your physician recommends that you continue on your current medications as directed. Please refer to the Current Medication list given to you today.  *If you need a refill on your cardiac medications before your next appointment, please call your pharmacy*  Follow-Up: At [facility], you and your health needs are our priority.  As part of our continuing mission to provide you with exceptional heart care, we have created designated Provider Care Teams.  These Care Teams include your primary Cardiologist (physician) and Advanced Practice Providers (APPs -  Physician Assistants and Nurse Practitioners) who all work together to provide you with the care you need, when you need it.  We recommend signing up for the patient portal called "MyChart".  Sign up information is provided on this After Visit Summary.  MyChart is used to connect with patients for Virtual Visits (  Telemedicine).  Patients are able to view lab/test results, encounter notes, upcoming appointments, etc.   Non-urgent messages can be sent to your provider as well.   To learn more about what you can do with MyChart, go to [email or web address].    Your next appointment:   6 month(s)  The format for your next appointment:   In Person  Provider:   [name], MD      Signed, [name], MD  05/28/2021 5:48 PM    [facility] [place] [address] [place], [place], [place]  [address] Phone: [phone]

## 2021-05-28 ENCOUNTER — Encounter: Payer: Self-pay | Admitting: Cardiovascular Disease

## 2021-05-29 ENCOUNTER — Other Ambulatory Visit: Payer: 59

## 2021-06-06 ENCOUNTER — Ambulatory Visit
Admission: RE | Admit: 2021-06-06 | Discharge: 2021-06-06 | Disposition: A | Discharge status: Home / Self Care | Payer: 59 | Source: Ambulatory Visit | Attending: Medical | Admitting: Medical

## 2021-06-06 ENCOUNTER — Other Ambulatory Visit: Payer: Self-pay | Admitting: Medical

## 2021-06-06 DIAGNOSIS — I712 Thoracic aortic aneurysm, without rupture, unspecified: Secondary | ICD-10-CM

## 2021-06-06 MED ORDER — IOPAMIDOL (ISOVUE-370) INJECTION 76%
100.0000 mL | Freq: Once | INTRAVENOUS | Status: AC | PRN
  Administered 2021-06-06: 100 mL via INTRAVENOUS

## 2021-06-09 ENCOUNTER — Other Ambulatory Visit: Payer: Self-pay

## 2021-06-09 DIAGNOSIS — I712 Thoracic aortic aneurysm, without rupture, unspecified: Secondary | ICD-10-CM

## 2021-08-29 ENCOUNTER — Other Ambulatory Visit: Payer: Self-pay | Admitting: Cardiovascular Disease

## 2021-08-29 DIAGNOSIS — E669 Obesity, unspecified: Secondary | ICD-10-CM

## 2021-08-29 DIAGNOSIS — N183 Chronic kidney disease, stage 3 unspecified: Secondary | ICD-10-CM

## 2021-08-29 DIAGNOSIS — E119 Type 2 diabetes mellitus without complications: Secondary | ICD-10-CM

## 2021-08-29 DIAGNOSIS — I10 Essential (primary) hypertension: Secondary | ICD-10-CM

## 2021-08-29 DIAGNOSIS — Z79899 Other long term (current) drug therapy: Secondary | ICD-10-CM

## 2021-08-29 DIAGNOSIS — Z01818 Encounter for other preprocedural examination: Secondary | ICD-10-CM

## 2021-08-29 MED ORDER — LISINOPRIL 40 MG PO TABS: 40.0000 mg | ORAL_TABLET | Freq: Every day | ORAL | 0 refills | Status: DC

## 2021-10-01 ENCOUNTER — Other Ambulatory Visit: Payer: Self-pay | Admitting: Cardiovascular Disease

## 2021-10-03 MED ORDER — AMLODIPINE BESYLATE 10 MG PO TABS: 10.0000 mg | ORAL_TABLET | Freq: Every day | ORAL | 3 refills | Status: DC

## 2021-10-03 MED ORDER — METOPROLOL TARTRATE 50 MG PO TABS: 50.0000 mg | ORAL_TABLET | Freq: Two times a day (BID) | ORAL | 3 refills | Status: DC

## 2021-10-03 NOTE — Telephone Encounter (Signed)
Sent message to patient medication refilled

## 2021-11-21 ENCOUNTER — Other Ambulatory Visit: Payer: Self-pay | Admitting: Cardiovascular Disease

## 2021-11-21 DIAGNOSIS — E669 Obesity, unspecified: Secondary | ICD-10-CM

## 2021-11-21 DIAGNOSIS — E119 Type 2 diabetes mellitus without complications: Secondary | ICD-10-CM

## 2021-11-21 DIAGNOSIS — Z79899 Other long term (current) drug therapy: Secondary | ICD-10-CM

## 2021-11-21 DIAGNOSIS — N183 Chronic kidney disease, stage 3 unspecified: Secondary | ICD-10-CM

## 2021-11-21 DIAGNOSIS — I10 Essential (primary) hypertension: Secondary | ICD-10-CM

## 2021-11-21 DIAGNOSIS — E66811 Obesity, class 1: Secondary | ICD-10-CM

## 2021-11-21 DIAGNOSIS — Z01818 Encounter for other preprocedural examination: Secondary | ICD-10-CM

## 2021-11-22 ENCOUNTER — Other Ambulatory Visit: Payer: Self-pay | Admitting: Cardiovascular Disease

## 2021-12-15 ENCOUNTER — Other Ambulatory Visit: Payer: Self-pay | Admitting: Cardiovascular Disease

## 2021-12-15 ENCOUNTER — Encounter: Payer: Self-pay | Admitting: Cardiovascular Disease

## 2021-12-15 DIAGNOSIS — Z79899 Other long term (current) drug therapy: Secondary | ICD-10-CM

## 2021-12-15 DIAGNOSIS — N183 Chronic kidney disease, stage 3 unspecified: Secondary | ICD-10-CM

## 2021-12-15 DIAGNOSIS — I10 Essential (primary) hypertension: Secondary | ICD-10-CM

## 2021-12-15 DIAGNOSIS — Z01818 Encounter for other preprocedural examination: Secondary | ICD-10-CM

## 2021-12-15 DIAGNOSIS — E119 Type 2 diabetes mellitus without complications: Secondary | ICD-10-CM

## 2021-12-15 DIAGNOSIS — E669 Obesity, unspecified: Secondary | ICD-10-CM

## 2021-12-15 MED ORDER — ATORVASTATIN CALCIUM 40 MG PO TABS: 40.0000 mg | ORAL_TABLET | Freq: Every day | ORAL | 1 refills | Status: DC

## 2021-12-15 MED ORDER — LISINOPRIL 40 MG PO TABS: 40.0000 mg | ORAL_TABLET | Freq: Every day | ORAL | 1 refills | Status: DC

## 2021-12-15 MED ORDER — SPIRONOLACTONE 25 MG PO TABS: 25.0000 mg | ORAL_TABLET | Freq: Every day | ORAL | 1 refills | Status: DC

## 2022-01-18 ENCOUNTER — Other Ambulatory Visit: Payer: Self-pay

## 2022-01-18 DIAGNOSIS — I712 Thoracic aortic aneurysm, without rupture, unspecified: Secondary | ICD-10-CM

## 2022-03-29 ENCOUNTER — Other Ambulatory Visit: Payer: Self-pay | Admitting: Cardiovascular Disease

## 2022-04-17 ENCOUNTER — Encounter: Payer: Self-pay | Admitting: Cardiovascular Disease

## 2022-04-26 ENCOUNTER — Other Ambulatory Visit (HOSPITAL_COMMUNITY): Payer: 59

## 2022-05-10 ENCOUNTER — Ambulatory Visit (HOSPITAL_COMMUNITY)
Admission: RE | Admit: 2022-05-10 | Discharge: 2022-05-10 | Disposition: A | Discharge status: Home / Self Care | Payer: 59 | Source: Ambulatory Visit | Attending: Cardiovascular Disease | Admitting: Cardiovascular Disease

## 2022-05-10 DIAGNOSIS — I712 Thoracic aortic aneurysm, without rupture, unspecified: Secondary | ICD-10-CM | POA: Insufficient documentation

## 2022-05-10 MED ORDER — IOHEXOL 350 MG/ML SOLN
100.0000 mL | Freq: Once | INTRAVENOUS | Status: AC | PRN
  Administered 2022-05-10: 100 mL via INTRAVENOUS

## 2022-05-22 ENCOUNTER — Encounter: Payer: Self-pay | Admitting: Cardiovascular Disease

## 2022-05-22 ENCOUNTER — Ambulatory Visit: Payer: 59 | Attending: Cardiovascular Disease | Admitting: Cardiovascular Disease

## 2022-05-22 VITALS — BP 142/90 | HR 63 | Ht 74.0 in | Wt 280.6 lb

## 2022-05-22 DIAGNOSIS — E785 Hyperlipidemia, unspecified: Secondary | ICD-10-CM

## 2022-05-22 DIAGNOSIS — E66812 Obesity, class 2: Secondary | ICD-10-CM

## 2022-05-22 DIAGNOSIS — I440 Atrioventricular block, first degree: Secondary | ICD-10-CM

## 2022-05-22 DIAGNOSIS — I10 Essential (primary) hypertension: Secondary | ICD-10-CM

## 2022-05-22 DIAGNOSIS — E669 Obesity, unspecified: Secondary | ICD-10-CM | POA: Diagnosis not present

## 2022-05-22 DIAGNOSIS — I7122 Aneurysm of the aortic arch, without rupture: Secondary | ICD-10-CM

## 2022-05-22 DIAGNOSIS — E119 Type 2 diabetes mellitus without complications: Secondary | ICD-10-CM

## 2022-05-22 DIAGNOSIS — G4733 Obstructive sleep apnea (adult) (pediatric): Secondary | ICD-10-CM | POA: Diagnosis not present

## 2022-05-22 MED ORDER — HYDRALAZINE HCL 25 MG PO TABS: 25.0000 mg | ORAL_TABLET | Freq: Two times a day (BID) | ORAL | 3 refills | Status: DC

## 2022-05-22 NOTE — Patient Instructions (Signed)
Medication Instructions:  START Hydralazine 25 mg twice daily   *If you need a refill on your cardiac medications before your next appointment, please call your pharmacy*  Follow-Up: At [facility], you and your health needs are our priority.  As part of our continuing mission to provide you with exceptional heart care, we have created designated Provider Care Teams.  These Care Teams include your primary Cardiologist (physician) and Advanced Practice Providers (APPs -  Physician Assistants and Nurse Practitioners) who all work together to provide you with the care you need, when you need it.  We recommend signing up for the patient portal called "MyChart".  Sign up information is provided on this After Visit Summary.  MyChart is used to connect with patients for Virtual Visits (Telemedicine).  Patients are able to view lab/test results, encounter notes, upcoming appointments, etc.  Non-urgent messages can be sent to your provider as well.   To learn more about what you can do with MyChart, go to [email or web address].    Your next appointment:   2-3 month(s)  The format for your next appointment:   In Person  Provider:   [name], MD

## 2022-05-22 NOTE — Progress Notes (Signed)
Cardiology Office Note    Date:  05/22/2022   ID:  Gregory Khan, Gregory Khan 1965/12/06, MRN [id number]  PCP:  Ozella Rocks, MD  Cardiologist:  Nicki Guadalajara, MD   4 month  F/U  office visit   History of Present Illness:  Gregory Khan is a 56 y.o. male who is the Production designer, theatre/television/film for global supply chain for [employer].  He was initially referred by Dr. Cleda Clarks at [facility] for cardiology consultation and further evaluation of hypertension.  I  saw for preoperative evaluation prior to knee surgery by Dr. Thurston Hole. I last saw him in June 2022. He presents for 4 month F/U cardiology/sleep clinic.  Gregory Khan admits to a >15 year history of hypertension as well as a three-year history of type 2 diabetes mellitus.  He also has a history of hyperlipidemia.  He has been followed by Dr. Melrose Nakayama and recently Dr. Cleda Clarks at [facility].  Remotely, he has been on Benicar HCT, amlodipine and metoprolol for hypertension.  Recently, his blood pressures have consistently been elevated and Benicar was discontinued and he is now on lisinopril/HCT 40/25 mg daily in addition to metoprolol 50 mg twice a day and amlodipine 10 mg daily.  Review of the records from [facility] indicate that his blood pressures have been in the 140s to 150s with diastolics in the low 90s.  Laboratory in 2017 had shown a glucose of 144.  BUN 20, Cr 1.09.  Normal LFTs.  He has been on atorvastatin 20 mg and cholesterol was 161, triglycerides 77, HDL 62, LDL 84.  TSH was normal at 1.4.  Hemoglobin 15 and hematocrit 45.1.  Hemoglobin A1c in January 2018 at improved to 5.7.  Chest x-ray suggested my mild cardiac enlargement without mediastinal widening and was without active cardiac pulmonary disease.  Earlier this year, there was a blood pressure reading in January of 162/105.    When I initially saw him, his blood pressure remained elevated.  With his long-standing hypertension I added  spironolactone for aldosterone blockade initially at 12.5 mg.   He has felt  improved on this.  I also scheduled him for an echo Doppler study which was done on 01/29/2017.  This showed moderate LVH.  He had normal systolic function.  Diastolic parameters were normal.  There was mild aortic root dilation.  His left atrium was mildly dilated.  He underwent subsequent laboratory.  Hemoglobin and hematocrit were stable.  He has mild renal insufficiency.  His most recent creatinine was 1.42.  ALT was minimally increased at 54 with a normal AST at 24.  He has felt improved.  When I last saw him, he had just returned from [place] and worked both in [place] in [place].  At that office visit, I recommended further titration of spironolactone to 25 mg daily since his blood pressure was improved, but remained elevated.  Recently, he had also been taking Voltaren.  He underwent follow-up labwork last week, which now showed a BUN that had risen by his report and 37 and creatinine at 1.50.  He was called by Dr. Jonetta Osgood office.  Voltaren was discontinued.  Repeat chemistry on August 10 showed a BUN had improved to 23 and his creatinine remained at 1.5.  LFTs were normal.  He feels the spironolactone has controlled his blood pressure.  He could sense greater diuretic effect.  He denied any chest pain or palpitations.  He was given clearance to undergo knee surgery.  He did have transient increasing creatinine which  improved with discontinuance of diclofenac.  He also was switched to lisinopril alone instead of combination with HCTZ.  He underwent knee surgery at the outpatient surgical Center on 04/17/2017 without cardiovascular compromise.  When I last saw him, pressure was trending upward upon his return to work from his surgery.  Is eating more and had gained over 5 pounds.  I recommended resumption of HCTZ 12.5 mg every other day.   When I  saw him in May 2019 he was doing well and denied any knee discomfort and was more active following his surgery.  During that evaluation he informed me that  his wife stated that he was snoring with significant intensity.  He was typically going to bed at 10:30 PM and waking up at 6:30 AM.  An Epworth Sleepiness Scale score endorsed at 8.    I referred him for a sleep study which was done on Jan 16, 2018.  This was notable for poor sleep efficiency and absence of supine sleep.  Although his overall AHI was 3.2, his RDI was moderately increased at 15.6.  He also had moderate sleep apnea during REM sleep with an AHI of 24.8 and oxygen desaturation to a nadir of 83%.  He was referred for CPAP titration study and required titration up to 14 cm water pressure.  Of note he also had frequent periodic limb movements of sleep.  He subsequently was set up with CPAP therapy on March 18, 2018 with a [employer] as his DME company.  I saw him for initial CPAP follow-up in September 2019.  At that time his blood pressure was stable on amlodipine 10 mg, lisinopril 40 mg, metoprolol 50 mg twice a day, and spironolactone 25 mg daily.  With CPAP initiation he noted significantly more energy.  A download was obtained from August 24 through May 11, 2018.  He is meeting compliance standards with 77% of usage days.  Usage to than 4 hours was compliant but he was only averaging 4 hours and 48 minutes of CPAP use per night at his 14 cm pressure.  AHI was excellent at 0.7.  He notes improvement in prior nocturia.  He is unaware of any snoring.  He denies residual daytime sleepiness.  An Epworth Sleepiness Scale score was calculated in the office today and this endorsed at 5.    Since I last saw him, he has been evaluated in February 2021 by Almyra Deforest, PA and more recently in March 2022 by Roby Lofts, PA-C.  I had not seen him in over 3 years and last saw him in June 2022.  Over the last 3 years, he admits to weight gain with the COVID pandemic of close to 20 pounds.  He was working remotely but since April has been back in the office.  He had laboratory in January 2022 which showed  hemoglobin A1c of 6.9, he was not anemic with a hemoglobin of 15.1 hematocrit 44.5.  TSH was 1.8.  Cholesterol was 94 with an HDL of 44.  Glucose was 150.  LFTs were normal.  Presently he denies any chest pain.  He is trying to increase his activity.  He has continued to use CPAP but compliance has been very poor.  I obtained a download from May 8 through January 23, 2021.  Usage days was only 23% and average use was only 1 hour and 44 minutes.  AHI was 6.3.  Typically he has been going to bed at 11 PM and waking up  at 7 AM.  During that evaluation, his blood pressure was stable on his regimen of amlodipine 10 mg, lisinopril 40 mg, metoprolol tartrate 50 mg twice a day in addition to spironolactone 25 mg daily.  He continues to be on glyburide 2.5 mg for diabetes and had recently been started on med Forman XR 500 mg daily.  He was on atorvastatin 20 mg and with his LDL of 94 in January 2020 I recommended further titration to 40 mg.  I had a lengthy discussion with him regarding optimal sleep duration at 7 to 8 hours with CPAP therapy.  Since I last saw him, he has felt improved.  He has travel to [place], [place], [place], and [place] for work.  He now has a new ResMed F 30i mask which is significantly improved from his prior mask.  I obtained a new download from September 4 through May 22, 2021.  He is now compliant with usage at 83% of days and usage on days used was 5 hours and 44 minutes.  His pressure is set at a range of 13 to 20 cm of water and AHI is 1.0 with 95th percentile pressure average at 13.7 and maximum average pressure 13.  He denies chest pain PND orthopnea.  He has been trying to lose some weight.  He presents for reevaluation.   Past Medical History:  Diagnosis Date   Diabetes mellitus without complication ([place])    Hyperlipidemia    Hypertension    Obesity     Past Surgical History:  Procedure Laterality Date   WISDOM TOOTH EXTRACTION      Current Medications: Outpatient  Medications Prior to Visit  Medication Sig Dispense Refill   amLODipine (NORVASC) 10 MG tablet Take 1 tablet (10 mg total) by mouth daily. 90 tablet 3   atorvastatin (LIPITOR) 40 MG tablet TAKE 1 TABLET BY MOUTH EVERY DAY 90 tablet 3   glyBURIDE (DIABETA) 2.5 MG tablet Take 1 tablet (2.5 mg total) by mouth 2 (two) times daily with a meal. 60 tablet 0   lisinopril (ZESTRIL) 40 MG tablet TAKE 1 TABLET BY MOUTH EVERY DAY 90 tablet 1   metFORMIN (GLUCOPHAGE-XR) 500 MG 24 hr tablet Take 500 mg by mouth 4 (four) times daily.     metoprolol tartrate (LOPRESSOR) 50 MG tablet Take 1 tablet (50 mg total) by mouth 2 (two) times daily. 180 tablet 3   spironolactone (ALDACTONE) 25 MG tablet TAKE 1 TABLET (25 MG TOTAL) BY MOUTH DAILY. *OFFICE VISIT NEEDED FOR FURTHER REFILLS* 30 tablet 1   zolpidem (AMBIEN) 10 MG tablet Take 5-10 mg by mouth at bedtime as needed.     lisinopril (ZESTRIL) 40 MG tablet Take 1 tablet (40 mg total) by mouth daily. 90 tablet 1   spironolactone (ALDACTONE) 25 MG tablet Take 1 tablet (25 mg total) by mouth daily. 90 tablet 1   No facility-administered medications prior to visit.     Allergies:   Patient has no known allergies.   Social History   Socioeconomic History   Marital status: Married    Spouse name: Not on file   Number of children: Not on file   Years of education: Not on file   Highest education level: Not on file  Occupational History   Not on file  Tobacco Use   Smoking status: Never   Smokeless tobacco: Never  Substance and Sexual Activity   Alcohol use: Yes    Alcohol/week: 0.0 standard drinks of alcohol  Comment: socially   Drug use: No   Sexual activity: Not on file  Other Topics Concern   Not on file  Social History Narrative   Not on file   Social Determinants of Health   Financial Resource Strain: Not on file  Food Insecurity: Not on file  Transportation Needs: Not on file  Physical Activity: Not on file  Stress: Not on file  Social  Connections: Not on file    Additional social history is notable in that 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  He does travel with work.  He is married for 24 years.  He has 2 children.  His undergraduate college was at [employer].  He does drink occasional wine and rum.  His exercise has been limited since his knee replacement, but previously had biked.  Family History:  The patient's family history includes Hypertension in his father.  His mother is currently living at age 66.  His father died at 70 but was a smoker and had previously undergone CABG revascularization surgery.  He has 2 brothers, ages 38 and 67 who are alive and well.  He has 2 children, ages 55 and 43.  ROS General: Negative; No fevers, chills, or night sweats; positive for weight gain HEENT: Negative; No changes in vision or hearing, sinus congestion, difficulty swallowing Pulmonary: Negative; No cough, wheezing, shortness of breath, hemoptysis Cardiovascular: See history of present illness GI: Negative; No nausea, vomiting, diarrhea, or abdominal pain GU: Negative; No dysuria, hematuria, or difficulty voiding Musculoskeletal: Status post right knee replacement in January 2018. Status post recent left knee surgery 04/17/2017 Hematologic/Oncology: Negative; no easy bruising, bleeding Endocrine: Negative; no heat/cold intolerance; no diabetes Neuro: Negative; no changes in balance, headaches Skin: Negative; No rashes or skin lesions Psychiatric: Negative; No behavioral problems, depression Sleep: Positive for OSA, CPAP set up March 18, 2018.  Initial DME company [employer], now [employer] Other comprehensive 14 point system review is negative.   PHYSICAL EXAM:   VS:  BP (!) 142/90   Pulse 63   Ht $R'6\' 2"'dh$  (1.88 m)   Wt 280 lb 9.6 oz (127.3 kg)   SpO2 96%   BMI 36.03 kg/m     Repeat blood pressure by me was 124/80  Wt Readings from Last 3 Encounters:  05/22/22 280 lb 9.6 oz  (127.3 kg)  05/24/21 289 lb 6.4 oz (131.3 kg)  01/23/21 294 lb 6.4 oz (133.5 kg)    Weight at office visit with me in 2019 was 276.    Physical Exam BP (!) 142/90   Pulse 63   Ht $R'6\' 2"'Xo$  (1.88 m)   Wt 280 lb 9.6 oz (127.3 kg)   SpO2 96%   BMI 36.03 kg/m  General: Alert, oriented, no distress.  Skin: normal turgor, no rashes, warm and dry HEENT: Normocephalic, atraumatic. Pupils equal round and reactive to light; sclera anicteric; extraocular muscles intact; Fundi ** Nose without nasal septal hypertrophy Mouth/Parynx benign; Mallinpatti scale Neck: No JVD, no carotid bruits; normal carotid upstroke Lungs: clear to ausculatation and percussion; no wheezing or rales Chest wall: without tenderness to palpitation Heart: PMI not displaced, RRR, s1 s2 normal, 1/6 systolic murmur, no diastolic murmur, no rubs, gallops, thrills, or heaves Abdomen: soft, nontender; no hepatosplenomehaly, BS+; abdominal aorta nontender and not dilated by palpation. Back: no CVA tenderness Pulses 2+ Musculoskeletal: full range of motion, normal strength, no joint deformities Extremities: no clubbing cyanosis or edema, Homan's sign  negative  Neurologic: grossly nonfocal; Cranial nerves grossly wnl Psychologic: Normal mood and affect    General: Alert, oriented, no distress.  Skin: normal turgor, no rashes, warm and dry HEENT: Normocephalic, atraumatic. Pupils equal round and reactive to light; sclera anicteric; extraocular muscles intact;  Nose without nasal septal hypertrophy Mouth/Parynx benign; Mallinpatti scale 3 Neck: No JVD, no carotid bruits; normal carotid upstroke Lungs: clear to ausculatation and percussion; no wheezing or rales Chest wall: without tenderness to palpitation Heart: PMI not displaced, RRR, s1 s2 normal, 1/6 systolic murmur, no diastolic murmur, no rubs, gallops, thrills, or heaves Abdomen: soft, nontender; no hepatosplenomehaly, BS+; abdominal aorta nontender and not dilated by  palpation. Back: no CVA tenderness Pulses 2+ Musculoskeletal: full range of motion, normal strength, no joint deformities Extremities: no clubbing cyanosis or edema, Homan's sign negative  Neurologic: grossly nonfocal; Cranial nerves grossly wnl Psychologic: Normal mood and affect    Studies/Labs Reviewed:   May 22, 2022 ECG (independently read by me): NSR at 63, 1st degree AV block; PR 210 msec  May 24, 2021 ECG (independently read by me): NSR at 70, no ectopy  May 2019 ECG (independently read by me): Normal sinus rhythm at 60 bpm.  PR interval 200 ms.  No ST segment changes.  October 2018 EKG:  EKG is ordered today.  Normal sinus rhythm at 72 bpm.  No ectopy.  Normal intervals.  Q wave in 3.  August 2018 ECG (independently read by me): Normal sinus rhythm at 67 bpm.  Borderline first-degree AV block with a PR interval of 206 ms.  TC interval normal at 388 ms.  No ST segment changes.  Small Q wave in lead 3, nondiagnostic  July 2018 ECG (independently read by me): Normal sinus rhythm at 74 bpm.  Normal intervals.  No ST segment changes.  No ectopy.  May 2018 ECG (independently read by me): Normal sinus rhythm at 66 bpm.  Borderline first degree AV block with a PR interval at 204 ms.  No ST segment changes.  Nondiagnostic Q wave in lead 3.  Recent Labs:    Latest Ref Rng & Units 05/19/2021    9:23 AM 06/14/2017    8:26 AM 04/05/2017   12:07 PM  BMP  Glucose 70 - 99 mg/dL 134  111  79   BUN 6 - 24 mg/dL $Remove'15  18  18   'XeIegoB$ Creatinine 0.76 - 1.27 mg/dL 0.99  1.15  1.23   BUN/Creat Ratio 9 - $R'20 15  16  15   'SD$ Sodium 134 - 144 mmol/L 138  139  143   Potassium 3.5 - 5.2 mmol/L 4.6  4.7  4.8   Chloride 96 - 106 mmol/L 102  102  105   CO2 20 - 29 mmol/L $RemoveB'20  22  21   'bxuEjBNT$ Calcium 8.7 - 10.2 mg/dL 10.3  9.8  9.9         Latest Ref Rng & Units 05/19/2021    9:23 AM 03/29/2017   11:13 AM 02/14/2017    8:38 AM  Hepatic Function  Total Protein 6.0 - 8.5 g/dL 6.8  7.1  6.6   Albumin 3.8 -  4.9 g/dL 4.4  4.4  4.2   AST 0 - 40 IU/L $Remov'27  23  24   'LdURZG$ ALT 0 - 44 IU/L 50  39  54   Alk Phosphatase 44 - 121 IU/L 88  86  68   Total Bilirubin 0.0 - 1.2 mg/dL 0.5  0.6  0.6  Latest Ref Rng & Units 02/14/2017    8:38 AM  CBC  WBC 3.4 - 10.8 x10E3/uL 6.2   Hemoglobin 13.0 - 17.7 g/dL 14.5   Hematocrit 37.5 - 51.0 % 43.1   Platelets 150 - 379 x10E3/uL 262    Lab Results  Component Value Date   MCV 86 02/14/2017   Lab Results  Component Value Date   TSH 1.880 02/14/2017   No results found for: "HGBA1C"   BNP No results found for: "BNP"  ProBNP No results found for: "PROBNP"   Lipid Panel     Component Value Date/Time   CHOL 148 05/19/2021 0923   TRIG 122 05/19/2021 0923   HDL 51 05/19/2021 0923   CHOLHDL 2.9 05/19/2021 0923   LDLCALC 75 05/19/2021 0923     RADIOLOGY: CT ANGIO CHEST AORTA W/CM & OR WO/CM  Result Date: 05/10/2022 CLINICAL DATA:  Thoracic aortic aneurysm without rupture EXAM: CT ANGIOGRAPHY CHEST WITH CONTRAST TECHNIQUE: Multidetector CT imaging of the chest was performed using the standard protocol during bolus administration of intravenous contrast. Multiplanar CT image reconstructions and MIPs were obtained to evaluate the vascular anatomy. RADIATION DOSE REDUCTION: This exam was performed according to the departmental dose-optimization program which includes automated exposure control, adjustment of the mA and/or kV according to patient size and/or use of iterative reconstruction technique. CONTRAST:  141mL OMNIPAQUE IOHEXOL 350 MG/ML SOLN COMPARISON:  06/06/2021 and previous FINDINGS: Cardiovascular: Heart size normal. No pericardial effusion. Satisfactory opacification of pulmonary arteries noted, and there is no evidence of pulmonary emboli. Scattered coronary calcifications. Good contrast opacification of the thoracic aorta. No dissection or stenosis. 3-vessel brachiocephalic arterial origin anatomy without proximal stenosis. Aortic Root: --Valve:  2.9 cm --Sinuses: 4.2 cm --Sinotubular Junction: 3.9 cm Limitations by motion: Mild Thoracic Aorta: --Ascending Aorta: 3.6 cm --Aortic Arch: 3.5 cm (previously 3.4 cm) --Descending Aorta: 2.9 cm Mediastinum/Nodes: No mass or adenopathy. Lungs/Pleura: No pleural effusion. No pneumothorax. Somewhat geographic ground-glass pulmonary opacities presumably related to mild atelectasis. Previously noted 7 mm subpleural nodule in the medial basal segment right lower lobe is partially obscured by dependent atelectasis. No new nodule or confluent airspace disease. Upper Abdomen: Fatty liver.  No acute findings. Musculoskeletal: Anterior vertebral endplate spurring at multiple levels in the mid and lower thoracic spine. Review of the MIP images confirms the above findings. 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 Recommend annual imaging followup by CTA or MRA. This recommendation follows 2010 ACCF/AHA/AATS/ACR/ASA/SCA/SCAI/SIR/STS/SVM Guidelines for the Diagnosis and Management of Patients with Thoracic Aortic Disease. Circulation.2010; 121: K539-J673 2. Fatty liver Electronically Signed   By: Lucrezia Europe M.D.   On: 05/10/2022 16:22     Additional studies/ records that were reviewed today include:  I reviewed the records from [place] including office evaluations as well as laboratory.  ECHO Study Conclusions: 01/29/17   - Left ventricle: The cavity size was normal. Wall thickness was   increased in a pattern of moderate LVH. Systolic function was   normal. The estimated ejection fraction was in the range of 60%   to 65%. Wall motion was normal; there were no regional wall   motion abnormalities. There was no evidence of elevated   ventricular filling pressure by Doppler parameters. - Aortic valve: Trileaflet; mildly thickened, mildly calcified   leaflets. - Aorta: Aortic root dimension: 43 mm (ED). - Ascending aorta: The ascending aorta was mildly dilated. - Left atrium: The atrium was  mildly dilated. - Right ventricle: The cavity size was  mildly dilated. Wall   thickness was normal.  ASSESSMENT:    No diagnosis found.   PLAN:  Gregory Khan is a 56 year-old male who has a history of obesity and at least an 18 year history of hypertension and 6 year history of type 2 diabetes mellitus.  Despite medical management, his blood pressure continued to be elevated which led to his initial evaluation with me on 01/15/2017.  When I initially saw him, he already was on amlodipine 10 mg, lisinopril HCT 40/25 mg, metoprolol 50 mg twice a day and I recommended the addition of spironolactone initially at 12.5 mg daily.  When I saw him in follow-up, he felt improved, but due to slight continued blood pressure elevation spironolactone further titrated to 25 mg prior to his knee surgery.  He was taken diclofenac and was on the combination lisinopril HCT.  Laboratory at Dr. Archie Endo office had shown an increasing creatinine to 1.5.  With discontinuance of diclofenac and changing him to just lisinopril alone renal function improved.  Subsequent creatinine was 1.23.  I had not seen him for 3 years.  From September 2019 until his last evaluation with me in June 2022.  During the [place] pandemic he had gained over 20 pounds.  At his June 2022 evaluation I recommended further titration of atorvastatin for 40 mg for more aggressive lipid management with his LDL cholesterol at 94.  Repeat laboratory in September 30 showed LDL cholesterol now at 75, HDL increased now at 51 and total cholesterol 148 with triglycerides 122.  BMI continues to be elevated at 37.16.  He is now using CPAP with improved compliance.  At his last office visit I changed his mask to a ResMed AirFit F 30i which he notices significant improvement from his prior mask.  I have recommended the need for optimal sleep duration which an adult should be 7 to 9 hours.  With CPAP use he typically has been using therapy for only 5 hours and 44  minutes and typically goes to bed around 11 PM and wakes up around 6:30 AM.  He is diabetic.  He is now on metformin in addition to glyburide.  I discussed the importance of increased exercise with AHA recommendations of at least 5 days/week for at least 30 minutes.  I will see him in 6 months for cardiology reevaluation or sooner as needed.   Medication Adjustments/Labs and Tests Ordered: Current medicines are reviewed at length with the patient today.  Concerns regarding medicines are outlined above.  Medication changes, Labs and Tests ordered today are listed in the Patient Instructions below. There are no Patient Instructions on file for this visit.   Signed, [name], MD  05/22/2022 10:05 AM    [facility] [address], [place], [place], [place]  [address] Phone: [phone]

## 2022-05-24 ENCOUNTER — Encounter: Payer: Self-pay | Admitting: Cardiovascular Disease

## 2022-05-28 ENCOUNTER — Other Ambulatory Visit: Payer: Self-pay | Admitting: Cardiovascular Disease

## 2022-05-28 DIAGNOSIS — N183 Chronic kidney disease, stage 3 unspecified: Secondary | ICD-10-CM

## 2022-05-28 DIAGNOSIS — I10 Essential (primary) hypertension: Secondary | ICD-10-CM

## 2022-05-28 DIAGNOSIS — E119 Type 2 diabetes mellitus without complications: Secondary | ICD-10-CM

## 2022-05-28 DIAGNOSIS — E66811 Obesity, class 1: Secondary | ICD-10-CM

## 2022-05-28 DIAGNOSIS — E669 Obesity, unspecified: Secondary | ICD-10-CM

## 2022-05-28 DIAGNOSIS — Z79899 Other long term (current) drug therapy: Secondary | ICD-10-CM

## 2022-05-28 DIAGNOSIS — Z01818 Encounter for other preprocedural examination: Secondary | ICD-10-CM

## 2022-07-18 ENCOUNTER — Other Ambulatory Visit: Payer: Self-pay | Admitting: Cardiovascular Disease

## 2022-08-08 ENCOUNTER — Ambulatory Visit: Payer: 59 | Attending: Cardiovascular Disease | Admitting: Cardiovascular Disease

## 2022-08-08 ENCOUNTER — Encounter: Payer: Self-pay | Admitting: Cardiovascular Disease

## 2022-08-08 VITALS — BP 132/94 | HR 74 | Ht 74.0 in | Wt 280.2 lb

## 2022-08-08 DIAGNOSIS — G4733 Obstructive sleep apnea (adult) (pediatric): Secondary | ICD-10-CM

## 2022-08-08 DIAGNOSIS — I10 Essential (primary) hypertension: Secondary | ICD-10-CM

## 2022-08-08 DIAGNOSIS — E66812 Obesity, class 2: Secondary | ICD-10-CM

## 2022-08-08 DIAGNOSIS — E785 Hyperlipidemia, unspecified: Secondary | ICD-10-CM

## 2022-08-08 DIAGNOSIS — E119 Type 2 diabetes mellitus without complications: Secondary | ICD-10-CM

## 2022-08-08 DIAGNOSIS — E669 Obesity, unspecified: Secondary | ICD-10-CM | POA: Diagnosis not present

## 2022-08-08 MED ORDER — METOPROLOL SUCCINATE ER 100 MG PO TB24: 100.0000 mg | ORAL_TABLET | Freq: Every day | ORAL | 6 refills | Status: DC

## 2022-08-08 NOTE — Patient Instructions (Signed)
Medication Instructions:  STOP HYDRALAZINE  STOP METOPROLOL TART  START METOPROLOL SUCCINATE '100MG'$  DAILY *If you need a refill on your cardiac medications before your next appointment, please call your pharmacy*   Lab Work: MAKE SURE TO GIVE [place] YOU LAB RESULTS FROM YOUR COMPANY  Testing/Procedures: NONE   Follow-Up: At [facility], you and your health needs are our priority.  As part of our continuing mission to provide you with exceptional heart care, we have created designated Provider Care Teams.  These Care Teams include your primary Cardiologist (physician) and Advanced Practice Providers (APPs -  Physician Assistants and Nurse Practitioners) who all work together to provide you with the care you need, when you need it.  Your next appointment:   4 month(s)  The format for your next appointment:   In Person  Provider:   [name], MD     Other Instructions MAKE SURE TO USE YOUR CPAP FOR AT LEAST 8 [place]

## 2022-08-08 NOTE — Progress Notes (Signed)
Cardiology Office Note    Date:  08/08/2022   ID:  Gregory, Khan 09-Jul-1966, MRN [id number]  PCP:  [name], MD  Cardiologist:  [name], MD   10 week F/U  office visit   History of Present Illness:  Gregory Khan is a 56 y.o. male who is the Freight forwarder for global supply chain for [name].  He was initially referred by Dr. Leola Brazil at [facility] for cardiology consultation and further evaluation of hypertension.  I  saw for preoperative evaluation prior to knee surgery by Dr. Noemi Chapel. I last saw him in October 2023.  He presents for follow-up cardiology/sleep evaluation.  Gregory Khan admits to a >15 year history of hypertension as well as a three-year history of type 2 diabetes mellitus.  He also has a history of hyperlipidemia.  He has been followed by Dr. Melanee Spry and recently Dr. Leola Brazil at [facility].  Remotely, he has been on Benicar HCT, amlodipine and metoprolol for hypertension.  Recently, his blood pressures have consistently been elevated and Benicar was discontinued and he is now on lisinopril/HCT 40/25 mg daily in addition to metoprolol 50 mg twice a day and amlodipine 10 mg daily.  Review of the records from [facility] indicate that his blood pressures have been in the 140s to 144R with diastolics in the low 15Q.  Laboratory in 2017 had shown a glucose of 144.  BUN 20, Cr 1.09.  Normal LFTs.  He has been on atorvastatin 20 mg and cholesterol was 161, triglycerides 77, HDL 62, LDL 84.  TSH was normal at 1.4.  Hemoglobin 15 and hematocrit 45.1.  Hemoglobin A1c in January 2018 at improved to 5.7.  Chest x-ray suggested my mild cardiac enlargement without mediastinal widening and was without active cardiac pulmonary disease.  Earlier this year, there was a blood pressure reading in January of 162/105.    When I initially saw him, his blood pressure remained elevated.  With his long-standing hypertension I added  spironolactone for aldosterone blockade initially at 12.5 mg.   He  has felt improved on this.  I also scheduled him for an echo Doppler study which was done on 01/29/2017.  This showed moderate LVH.  He had normal systolic function.  Diastolic parameters were normal.  There was mild aortic root dilation.  His left atrium was mildly dilated.  He underwent subsequent laboratory.  Hemoglobin and hematocrit were stable.  He has mild renal insufficiency.  His most recent creatinine was 1.42.  ALT was minimally increased at 54 with a normal AST at 24.  He has felt improved.  When I last saw him, he had just returned from [place] and worked both in [place] in [place].  At that office visit, I recommended further titration of spironolactone to 25 mg daily since his blood pressure was improved, but remained elevated.  Recently, he had also been taking Voltaren.  He underwent follow-up labwork last week, which now showed a BUN that had risen by his report and 37 and creatinine at 1.50.  He was called by Dr. Laureen Abrahams office.  Voltaren was discontinued.  Repeat chemistry on August 10 showed a BUN had improved to 23 and his creatinine remained at 1.5.  LFTs were normal.  He feels the spironolactone has controlled his blood pressure.  He could sense greater diuretic effect.  He denied any chest pain or palpitations.  He was given clearance to undergo knee surgery.  He did have transient increasing  creatinine which improved with discontinuance of diclofenac.  He also was switched to lisinopril alone instead of combination with HCTZ.  He underwent knee surgery at the outpatient surgical Center on 04/17/2017 without cardiovascular compromise.  When I last saw him, pressure was trending upward upon his return to work from his surgery.  Is eating more and had gained over 5 pounds.  I recommended resumption of HCTZ 12.5 mg every other day.   When I  saw him in May 2019 he was doing well and denied any knee discomfort and was more active following his surgery.  During that evaluation he informed  me that his wife stated that he was snoring with significant intensity.  He was typically going to bed at 10:30 PM and waking up at 6:30 AM.  An Epworth Sleepiness Scale score endorsed at 8.    I referred him for a sleep study which was done on Jan 16, 2018.  This was notable for poor sleep efficiency and absence of supine sleep.  Although his overall AHI was 3.2, his RDI was moderately increased at 15.6.  He also had moderate sleep apnea during REM sleep with an AHI of 24.8 and oxygen desaturation to a nadir of 83%.  He was referred for CPAP titration study and required titration up to 14 cm water pressure.  Of note he also had frequent periodic limb movements of sleep.  He subsequently was set up with CPAP therapy on March 18, 2018 with a [employer] as his DME company.  I saw him for initial CPAP follow-up in September 2019.  At that time his blood pressure was stable on amlodipine 10 mg, lisinopril 40 mg, metoprolol 50 mg twice a day, and spironolactone 25 mg daily.  With CPAP initiation he noted significantly more energy.  A download was obtained from August 24 through May 11, 2018.  He is meeting compliance standards with 77% of usage days.  Usage to than 4 hours was compliant but he was only averaging 4 hours and 48 minutes of CPAP use per night at his 14 cm pressure.  AHI was excellent at 0.7.  He notes improvement in prior nocturia.  He is unaware of any snoring.  He denies residual daytime sleepiness.  An Epworth Sleepiness Scale score was calculated in the office today and this endorsed at 5.    Since I last saw him, he has been evaluated in February 2021 by Almyra Deforest, PA and more recently in March 2022 by Roby Lofts, PA-C.  I had not seen him in over 3 years and last saw him in June 2022.  Over the last 3 years, he admits to weight gain with the COVID pandemic of close to 20 pounds.  He was working remotely but since April has been back in the office.  He had laboratory in January 2022 which  showed hemoglobin A1c of 6.9, he was not anemic with a hemoglobin of 15.1 hematocrit 44.5.  TSH was 1.8.  Cholesterol was 94 with an HDL of 44.  Glucose was 150.  LFTs were normal.  Presently he denies any chest pain.  He is trying to increase his activity.  He has continued to use CPAP but compliance has been very poor.  I obtained a download from May 8 through January 23, 2021.  Usage days was only 23% and average use was only 1 hour and 44 minutes.  AHI was 6.3.  Typically he has been going to bed at 11 PM and  waking up at 7 AM.  During that evaluation, his blood pressure was stable on his regimen of amlodipine 10 mg, lisinopril 40 mg, metoprolol tartrate 50 mg twice a day in addition to spironolactone 25 mg daily.  He continues to be on glyburide 2.5 mg for diabetes and had recently been started on med Forman XR 500 mg daily.  He was on atorvastatin 20 mg and with his LDL of 94 in January 2020 I recommended further titration to 40 mg.  I had a lengthy discussion with him regarding optimal sleep duration at 7 to 8 hours with CPAP therapy.  I saw him on May 24, 2021.  He had been doing extensive travel for work in [place], [place], [place], and [place].  He now has a new ResMed F 30i mask which is significantly improved from his prior mask.  I obtained a new download from September 4 through May 22, 2021.  He is compliant with CPAP usage  at 83% of days and usage on days used was 5 hours and 44 minutes.  His pressure is set at a range of 13 to 20 cm of water and AHI is 1.0 with 95th percentile pressure average at 13.7 and maximum average pressure 13.  He denied chest pain PND orthopnea.  He has been trying to lose some weight.  I discussed with him the importance of trying to exercise at least 5 days/week for at least 30 minutes per AHA recommendations.  I last saw him on May 22, 2022.  At that time, he continued to be on amlodipine 10 mg, lisinopril 40 mg, Toprol tartrate 50 mg twice a day and  spironolactone 25 mg daily.  He was also on glimepiride and metformin for diabetes mellitus and atorvastatin 40 mg daily for hyperlipidemia.  LDL cholesterol September 2022 was 75 and when rechecked in April 2023 at [facility] LDL was 80.  During that evaluation, I obtained a download on his CPAP and he was not very compliant with reference to usage.  His dog often times is waking up at night barking and he would go to a different room and not use CPAP.  His blood pressure was elevated and elected to add low-dose hydralazine initially at 25 mg twice a day to his blood pressure regimen.  On this regimen, he states his systolic blood pressure has been running around 1 25-1 30 but his diastolic blood pressure is consistently been elevated in the upper 80s to around 90.  He admits to a headache following initiation of hydralazine.  His resting pulse is also increased now to upper 70s to at times low 80s.  I obtained a new download of CPAP use today in the office.  He is compliant with usage days but unfortunately usage greater than 4 hours is suboptimal at only 15 days.  His average use is only 4 hours and 20 minutes.  He oftentimes would not immediately put his mask back on after coming back from the bathroom after 4 hours of sleep.  He typically goes to bed around 11 PM and wakes up at 6:30 AM.  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  He presents for reevaluation.   Past Medical History:  Diagnosis Date   Diabetes mellitus without complication (HCC)    Hyperlipidemia    Hypertension    Obesity     Past Surgical History:  Procedure Laterality Date   WISDOM TOOTH EXTRACTION        Current Medications: Outpatient Medications Prior to Visit  Medication Sig Dispense Refill   amLODipine (NORVASC) 10 MG tablet TAKE 1 TABLET BY MOUTH EVERY DAY 90 tablet 0   atorvastatin (LIPITOR) 40 MG tablet TAKE 1 TABLET BY MOUTH EVERY DAY 90 tablet 3   glyBURIDE (DIABETA)  2.5 MG tablet Take 1 tablet (2.5 mg total) by mouth 2 (two) times daily with a meal. 60 tablet 0   lisinopril (ZESTRIL) 40 MG tablet TAKE 1 TABLET BY MOUTH EVERY DAY 90 tablet 1   metFORMIN (GLUCOPHAGE-XR) 500 MG 24 hr tablet Take 500 mg by mouth 4 (four) times daily.     spironolactone (ALDACTONE) 25 MG tablet TAKE 1 TABLET (25 MG TOTAL) BY MOUTH DAILY. 90 tablet 1   zolpidem (AMBIEN) 10 MG tablet Take 5-10 mg by mouth at bedtime as needed.     hydrALAZINE (APRESOLINE) 25 MG tablet Take 1 tablet (25 mg total) by mouth in the morning and at bedtime. 180 tablet 3   metoprolol tartrate (LOPRESSOR) 50 MG tablet Take 1 tablet (50 mg total) by mouth 2 (two) times daily. 180 tablet 3   No facility-administered medications prior to visit.     Allergies:   Patient has no known allergies.   Social History   Socioeconomic History   Marital status: Married    Spouse name: Not on file   Number of children: Not on file   Years of education: Not on file   Highest education level: Not on file  Occupational History   Not on file  Tobacco Use   Smoking status: Never   Smokeless tobacco: Never  Substance and Sexual Activity   Alcohol use: Yes    Alcohol/week: 0.0 standard drinks of alcohol    Comment: socially   Drug use: No   Sexual activity: Not on file  Other Topics Concern   Not on file  Social History Narrative   Not on file   Social Determinants of Health   Financial Resource Strain: Not on file  Food Insecurity: Not on file  Transportation Needs: Not on file  Physical Activity: Not on file  Stress: Not on file  Social Connections: Not on file    Additional social history is notable in that 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  He does travel with work.  He is married for 24 years.  He has 2 children.  His undergraduate college was at [employer].  He does drink occasional wine and rum.  His exercise has been limited since his knee  replacement, but previously had biked.  Family History:  The patient's family history includes Hypertension in his father.  His mother is currently living at age 80.  His father died at 83 but was a smoker and had previously undergone CABG revascularization surgery.  He has 2 brothers, ages 56 and 53 who are alive and well.  He has 2 children, ages 22 and 18.  ROS General: Negative; No fevers, chills, or night sweats; positive for weight gain HEENT: Negative; No changes in vision or hearing, sinus congestion, difficulty swallowing Pulmonary: Negative; No cough, wheezing, shortness of breath, hemoptysis Cardiovascular: See history of present illness GI: Negative; No nausea, vomiting, diarrhea, or abdominal pain GU: Negative; No dysuria, hematuria, or difficulty voiding Musculoskeletal: Status post right knee replacement in January 2018. Status post recent left knee surgery 04/17/2017 Hematologic/Oncology: Negative; no easy bruising, bleeding Endocrine: Negative; no heat/cold intolerance; no diabetes Neuro: Negative;   no changes in balance, headaches Skin: Negative; No rashes or skin lesions Psychiatric: Negative; No behavioral problems, depression Sleep: Positive for OSA, CPAP set up March 18, 2018.  Initial DME company [employer], now [employer] Other comprehensive 14 point system review is negative.   PHYSICAL EXAM:   VS:  BP (!) 132/94   Pulse 74   Ht 6' 2" (1.88 m)   Wt 280 lb 3.7 oz (127.1 kg)   SpO2 93%   BMI 35.98 kg/m     Repeat blood pressure by me was 128/88 which is improved from his last office visit.  Wt Readings from Last 3 Encounters:  08/08/22 280 lb 3.7 oz (127.1 kg)  05/22/22 280 lb 9.6 oz (127.3 kg)  05/24/21 289 lb 6.4 oz (131.3 kg)    Weight at office visit with me in 2019 was 276.  General: Alert, oriented, no distress.  Skin: normal turgor, no rashes, warm and dry HEENT: Normocephalic, atraumatic. Pupils equal round and reactive to light; sclera anicteric;  extraocular muscles intact Nose without nasal septal hypertrophy Mouth/Parynx benign; Mallinpatti scale Neck: No JVD, no carotid bruits; normal carotid upstroke Lungs: clear to ausculatation and percussion; no wheezing or rales Chest wall: without tenderness to palpitation Heart: PMI not displaced, RRR, s1 s2 normal, 1/6 systolic murmur, no diastolic murmur, no rubs, gallops, thrills, or heaves Abdomen: Mild central adiposity ;soft, nontender; no hepatosplenomehaly, BS+; abdominal aorta nontender and not dilated by palpation. Back: no CVA tenderness Pulses 2+ Musculoskeletal: full range of motion, normal strength, no joint deformities Extremities: no clubbing cyanosis or edema, Homan's sign negative  Neurologic: grossly nonfocal; Cranial nerves grossly wnl Psychologic: Normal mood and affect     Studies/Labs Reviewed:   August 08, 2022 ECG (independently read by me): NSR at 76, baseline artifact  May 22, 2022 ECG (independently read by me): NSR at 63, 1st degree AV block; PR 210 msec  May 24, 2021 ECG (independently read by me): NSR at 70, no ectopy  May 2019 ECG (independently read by me): Normal sinus rhythm at 60 bpm.  PR interval 200 ms.  No ST segment changes.  October 2018 EKG:  EKG is ordered today.  Normal sinus rhythm at 72 bpm.  No ectopy.  Normal intervals.  Q wave in 3.  August 2018 ECG (independently read by me): Normal sinus rhythm at 67 bpm.  Borderline first-degree AV block with a PR interval of 206 ms.  TC interval normal at 388 ms.  No ST segment changes.  Small Q wave in lead 3, nondiagnostic  July 2018 ECG (independently read by me): Normal sinus rhythm at 74 bpm.  Normal intervals.  No ST segment changes.  No ectopy.  May 2018 ECG (independently read by me): Normal sinus rhythm at 66 bpm.  Borderline first degree AV block with a PR interval at 204 ms.  No ST segment changes.  Nondiagnostic Q wave in lead 3.  Recent Labs:    Latest Ref Rng & Units  05/19/2021    9:23 AM 06/14/2017    8:26 AM 04/05/2017   12:07 PM  BMP  Glucose 70 - 99 mg/dL 134  111  79   BUN 6 - 24 mg/dL _0 Creatinine 0.76 - 1.27 mg/dL 0.99  1.15  1.23   BUN/Creat Ratio 9 - _1 Sodium 134 - 144 mmol/L 138  139  143   Potassium 3.5 - 5.2 mmol/L 4.6  4.7  4.8   Chloride 96 - 106 mmol/L 102  102  105   CO2 20 - 29 mmol/L _0 Calcium 8.7 - 10.2 mg/dL 10.3  9.8  9.9         Latest Ref Rng & Units 05/19/2021    9:23 AM 03/29/2017   11:13 AM 02/14/2017    8:38 AM  Hepatic Function  Total Protein 6.0 - 8.5 g/dL 6.8  7.1  6.6   Albumin 3.8 - 4.9 g/dL 4.4  4.4  4.2   AST 0 - 40 IU/L _1 ALT 0 - 44 IU/L 50  39  54   Alk Phosphatase 44 - 121 IU/L 88  86  68   Total Bilirubin 0.0 - 1.2 mg/dL 0.5  0.6  0.6        Latest Ref Rng & Units 02/14/2017    8:38 AM  CBC  WBC 3.4 - 10.8 x10E3/uL 6.2   Hemoglobin 13.0 - 17.7 g/dL 14.5   Hematocrit 37.5 - 51.0 % 43.1   Platelets 150 - 379 x10E3/uL 262    Lab Results  Component Value Date   MCV 86 02/14/2017   Lab Results  Component Value Date   TSH 1.880 02/14/2017   No results found for: "HGBA1C"   BNP No results found for: "BNP"  ProBNP No results found for: "PROBNP"   Lipid Panel     Component Value Date/Time   CHOL 148 05/19/2021 0923   TRIG 122 05/19/2021 0923   HDL 51 05/19/2021 0923   CHOLHDL 2.9 05/19/2021 0923   LDLCALC 75 05/19/2021 0923     RADIOLOGY: No results found.   Additional studies/ records that were reviewed today include:  I reviewed the records from [facility] including office evaluations as well as laboratory.  ECHO Study Conclusions: 01/29/17   - Left ventricle: The cavity size was normal. Wall thickness was   increased in a pattern of moderate LVH. Systolic function was   normal. The estimated ejection fraction was in the range of 60%   to 65%. Wall motion was normal; there were no regional wall   motion abnormalities. There was no  evidence of elevated   ventricular filling pressure by Doppler parameters. - Aortic valve: Trileaflet; mildly thickened, mildly calcified   leaflets. - Aorta: Aortic root dimension: 43 mm (ED). - Ascending aorta: The ascending aorta was mildly dilated. - Left atrium: The atrium was mildly dilated. - Right ventricle: The cavity size was mildly dilated. Wall   thickness was normal.  ASSESSMENT:    1. Essential hypertension   2. OSA (obstructive sleep apnea)   3. Obesity, Class II, BMI 35-39.9   4. Hyperlipidemia LDL goal <70   5. Type 2 diabetes mellitus without complication, without long-term current use of insulin [facility])     PLAN:  Gregory Khan is a 56 year-old male who has a history of obesity and at least a 19 year history of hypertension and 7 year history of type 2 diabetes mellitus.  Despite medical management, his blood pressure continued to be elevated which led to his initial evaluation with me on 01/15/2017.  When I initially saw him, he already was on amlodipine 10 mg, lisinopril HCT 40/25 mg, metoprolol 50 mg twice a day and I recommended the addition of spironolactone initially at 12.5 mg daily.  When I saw him in follow-up, he felt improved, but due to slight continued  blood pressure elevation spironolactone further titrated to 25 mg prior to his knee surgery.  At that time he was on diclofenac and combination lisinopril HCT.  Laboratory at Dr. Wainer's office had shown an increasing creatinine to 1.5.  With discontinuance of diclofenac and changing him to just lisinopril alone renal function improved.  Subsequent creatinine was 1.23.  I had not seen him for 3 years.  From September 2019 until his last evaluation with me in June 2022.  During the COVID pandemic he had gained over 20 pounds.  At his June 2022 evaluation I recommended further titration of atorvastatin for 40 mg for more aggressive lipid management with his LDL cholesterol at 94.  Repeat laboratory in May 19, 2021  showed LDL cholesterol now at 75, HDL increased now at 51 and total cholesterol 148 with triglycerides 122.  BMI was elevated at 37.16.  At his  visit in October 2022 he was compliant with CPAP therapy.  When I saw him in October 2023 his CPAP compliance was significantly reduced and oftentimes he was waking up in the middle of the night because his dog was barking very loud and he would go to a different part of the house to sleep and not take his CPAP with him.  Presently, I obtained a new download, while using CPAP, CPAP PAP is excellent with an AHI of 0.9.  His CPAP is set at a pressure range of 13 to 20 cm and his 95th percentile average is 13.3 with maximum at 13.6.  Download shows only 4 hours and 20 minutes of use.  I suspect, this suboptimal use duration particularly not consistently using CPAP in the second half of the night where the preponderance of REM sleep will occur, he is subjecting himself to difficult to control blood pressure.  He has noticed a headache since initiating hydralazine and would prefer that I discontinue this.  Presently, I will continue amlodipine 10 mg, and lisinopril 40 mg.  I am changing his metoprolol to tartrate from 50 mg twice a day to 100 mg of metoprolol succinate daily.  He will continue spironolactone 25 mg.  I discussed with him the importance of trying to sleep for at least 7 to 8 hours if at all possible with CPAP therapy per night.  This should further improve his blood pressure. I discussed the importance of exercise and weight loss.  He continues to be on atorvastatin 40 mg daily and will have repeat laboratoryr at [employer] in early April.  I plan to see him in follow-up in late April or early May and if at that time his blood pressure continues to be elevated additional medical therapy will be implemented.     Medication Adjustments/Labs and Tests Ordered: Current medicines are reviewed at length with the patient today.  Concerns regarding medicines are  outlined above.  Medication changes, Labs and Tests ordered today are listed in the Patient Instructions below. Patient Instructions  Medication Instructions:  STOP HYDRALAZINE  STOP METOPROLOL TART  START METOPROLOL SUCCINATE 100MG DAILY *If you need a refill on your cardiac medications before your next appointment, please call your pharmacy*   Lab Work: MAKE SURE TO GIVE US YOU LAB RESULTS FROM YOUR COMPANY  Testing/Procedures: NONE   Follow-Up: At [facility], you and your health needs are our priority.  As part of our continuing mission to provide you with exceptional heart care, we have created designated Provider Care Teams.  These Care Teams include your primary   Cardiologist (physician) and Advanced Practice Providers (APPs -  Physician Assistants and Nurse Practitioners) who all work together to provide you with the care you need, when you need it.  Your next appointment:   4 month(s)  The format for your next appointment:   In Person  Provider:   Thomas Kelly, MD     Other Instructions MAKE SURE TO USE YOUR CPAP FOR AT LEAST 8 HOURS DAILY  Important Information About Sugar         Signed, Thomas Kelly, MD  08/08/2022 1:14 PM    [facility] [address], [address], [place], [place]  [address] Phone: [phone]    

## 2022-10-01 ENCOUNTER — Other Ambulatory Visit: Payer: Self-pay | Admitting: Cardiovascular Disease

## 2022-10-01 MED ORDER — AMLODIPINE BESYLATE 10 MG PO TABS: 10.0000 mg | ORAL_TABLET | Freq: Every day | ORAL | 0 refills | Status: DC

## 2022-11-03 ENCOUNTER — Encounter: Payer: Self-pay | Admitting: Cardiovascular Disease

## 2022-11-05 MED ORDER — ATORVASTATIN CALCIUM 40 MG PO TABS: 40.0000 mg | ORAL_TABLET | Freq: Every day | ORAL | 3 refills | Status: DC

## 2022-11-26 ENCOUNTER — Other Ambulatory Visit: Payer: Self-pay | Admitting: Cardiovascular Disease

## 2022-11-26 DIAGNOSIS — I10 Essential (primary) hypertension: Secondary | ICD-10-CM

## 2022-11-26 DIAGNOSIS — N183 Chronic kidney disease, stage 3 unspecified: Secondary | ICD-10-CM

## 2022-11-26 DIAGNOSIS — Z79899 Other long term (current) drug therapy: Secondary | ICD-10-CM

## 2022-11-26 DIAGNOSIS — Z01818 Encounter for other preprocedural examination: Secondary | ICD-10-CM

## 2022-11-26 DIAGNOSIS — E669 Obesity, unspecified: Secondary | ICD-10-CM

## 2022-11-26 DIAGNOSIS — E119 Type 2 diabetes mellitus without complications: Secondary | ICD-10-CM

## 2022-12-21 ENCOUNTER — Encounter: Payer: Self-pay | Admitting: Cardiovascular Disease

## 2022-12-21 ENCOUNTER — Ambulatory Visit: Payer: 59 | Attending: Cardiovascular Disease | Admitting: Cardiovascular Disease

## 2022-12-21 DIAGNOSIS — I10 Essential (primary) hypertension: Secondary | ICD-10-CM | POA: Diagnosis not present

## 2022-12-21 DIAGNOSIS — E785 Hyperlipidemia, unspecified: Secondary | ICD-10-CM | POA: Diagnosis not present

## 2022-12-21 DIAGNOSIS — E119 Type 2 diabetes mellitus without complications: Secondary | ICD-10-CM

## 2022-12-21 DIAGNOSIS — E669 Obesity, unspecified: Secondary | ICD-10-CM

## 2022-12-21 DIAGNOSIS — G4733 Obstructive sleep apnea (adult) (pediatric): Secondary | ICD-10-CM | POA: Diagnosis not present

## 2022-12-21 DIAGNOSIS — Z7985 Long-term (current) use of injectable non-insulin antidiabetic drugs: Secondary | ICD-10-CM

## 2022-12-21 NOTE — Progress Notes (Unsigned)
Cardiology Office Note    Date:  12/23/2022   ID:  Tomoki, Santy [birth year], MRN [id number]  PCP:  Ozella Rocks, MD  Cardiologist:  Nicki Guadalajara, MD   5 month F/U  office visit   History of Present Illness:  NEIKO WIK is a 57 y.o. male who is the Production designer, theatre/television/film for global supply chain for [employer].  He was initially referred by Dr. Cleda Clarks at [facility] for cardiology consultation and further evaluation of hypertension.  I  saw for preoperative evaluation prior to knee surgery by Dr. Thurston Hole. I last saw him in December 2023.  He presents for follow-up cardiology/sleep evaluation.  Mr. Sciacca admits to a >15 year history of hypertension as well as a three-year history of type 2 diabetes mellitus.  He also has a history of hyperlipidemia.  He has been followed by Dr. Melrose Nakayama and recently Dr. Cleda Clarks at [facility].  Remotely, he has been on Benicar HCT, amlodipine and metoprolol for hypertension.  Recently, his blood pressures have consistently been elevated and Benicar was discontinued and he is now on lisinopril/HCT 40/25 mg daily in addition to metoprolol 50 mg twice a day and amlodipine 10 mg daily.  Review of the records from [facility] indicate that his blood pressures have been in the 140s to 150s with diastolics in the low 90s.  Laboratory in 2017 had shown a glucose of 144.  BUN 20, Cr 1.09.  Normal LFTs.  He has been on atorvastatin 20 mg and cholesterol was 161, triglycerides 77, HDL 62, LDL 84.  TSH was normal at 1.4.  Hemoglobin 15 and hematocrit 45.1.  Hemoglobin A1c in January 2018 at improved to 5.7.  Chest x-ray suggested my mild cardiac enlargement without mediastinal widening and was without active cardiac pulmonary disease.  Earlier this year, there was a blood pressure reading in January of 162/105.    When I initially saw him, his blood pressure remained elevated.  With his long-standing hypertension I added  spironolactone for aldosterone blockade initially at 12.5 mg.   He has  felt improved on this.  I also scheduled him for an echo Doppler study which was done on 01/29/2017.  This showed moderate LVH.  He had normal systolic function.  Diastolic parameters were normal.  There was mild aortic root dilation.  His left atrium was mildly dilated.  He underwent subsequent laboratory.  Hemoglobin and hematocrit were stable.  He has mild renal insufficiency.  His most recent creatinine was 1.42.  ALT was minimally increased at 54 with a normal AST at 24.  He has felt improved.  When I last saw him, he had just returned from [place] and worked both in [place] in [place].  At that office visit, I recommended further titration of spironolactone to 25 mg daily since his blood pressure was improved, but remained elevated.  Recently, he had also been taking Voltaren.  He underwent follow-up labwork last week, which now showed a BUN that had risen by his report and 37 and creatinine at 1.50.  He was called by Dr. Jonetta Osgood office.  Voltaren was discontinued.  Repeat chemistry on August 10 showed a BUN had improved to 23 and his creatinine remained at 1.5.  LFTs were normal.  He feels the spironolactone has controlled his blood pressure.  He could sense greater diuretic effect.  He denied any chest pain or palpitations.  He was given clearance to undergo knee surgery.  He did have transient increasing  creatinine which improved with discontinuance of diclofenac.  He also was switched to lisinopril alone instead of combination with HCTZ.  He underwent knee surgery at the outpatient surgical Center on 04/17/2017 without cardiovascular compromise.  When I last saw him, pressure was trending upward upon his return to work from his surgery.  Is eating more and had gained over 5 pounds.  I recommended resumption of HCTZ 12.5 mg every other day.   When I  saw him in May 2019 he was doing well and denied any knee discomfort and was more active following his surgery.  During that evaluation he informed me  that his wife stated that he was snoring with significant intensity.  He was typically going to bed at 10:30 PM and waking up at 6:30 AM.  An Epworth Sleepiness Scale score endorsed at 8.    I referred him for a sleep study which was done on Jan 16, 2018.  This was notable for poor sleep efficiency and absence of supine sleep.  Although his overall AHI was 3.2, his RDI was moderately increased at 15.6.  He also had moderate sleep apnea during REM sleep with an AHI of 24.8 and oxygen desaturation to a nadir of 83%.  He was referred for CPAP titration study and required titration up to 14 cm water pressure.  Of note he also had frequent periodic limb movements of sleep.  He subsequently was set up with CPAP therapy on March 18, 2018 with a [employer] as his DME company.  I saw him for initial CPAP follow-up in September 2019.  At that time his blood pressure was stable on amlodipine 10 mg, lisinopril 40 mg, metoprolol 50 mg twice a day, and spironolactone 25 mg daily.  With CPAP initiation he noted significantly more energy.  A download was obtained from August 24 through May 11, 2018.  He is meeting compliance standards with 77% of usage days.  Usage to than 4 hours was compliant but he was only averaging 4 hours and 48 minutes of CPAP use per night at his 14 cm pressure.  AHI was excellent at 0.7.  He notes improvement in prior nocturia.  He is unaware of any snoring.  He denies residual daytime sleepiness.  An Epworth Sleepiness Scale score was calculated in the office today and this endorsed at 5.    Since I last saw him, he has been evaluated in February 2021 by Azalee Course, PA and more recently in March 2022 by Judy Pimple, PA-C.  I had not seen him in over 3 years and last saw him in June 2022.  Over the last 3 years, he admits to weight gain with the COVID pandemic of close to 20 pounds.  He was working remotely but since April has been back in the office.  He had laboratory in January 2022 which  showed hemoglobin A1c of 6.9, he was not anemic with a hemoglobin of 15.1 hematocrit 44.5.  TSH was 1.8.  Cholesterol was 94 with an HDL of 44.  Glucose was 150.  LFTs were normal.  Presently he denies any chest pain.  He is trying to increase his activity.  He has continued to use CPAP but compliance has been very poor.  I obtained a download from May 8 through January 23, 2021.  Usage days was only 23% and average use was only 1 hour and 44 minutes.  AHI was 6.3.  Typically he has been going to bed at 11 PM and  waking up at 7 AM.  During that evaluation, his blood pressure was stable on his regimen of amlodipine 10 mg, lisinopril 40 mg, metoprolol tartrate 50 mg twice a day in addition to spironolactone 25 mg daily.  He continues to be on glyburide 2.5 mg for diabetes and had recently been started on med Forman XR 500 mg daily.  He was on atorvastatin 20 mg and with his LDL of 94 in January 2020 I recommended further titration to 40 mg.  I had a lengthy discussion with him regarding optimal sleep duration at 7 to 8 hours with CPAP therapy.  I saw him on May 24, 2021.  He had been doing extensive travel for work in [place], [place], [place], and [place].  He now has a new ResMed F 30i mask which is significantly improved from his prior mask.  I obtained a new download from September 4 through May 22, 2021.  He is compliant with CPAP usage  at 83% of days and usage on days used was 5 hours and 44 minutes.  His pressure is set at a range of 13 to 20 cm of water and AHI is 1.0 with 95th percentile pressure average at 13.7 and maximum average pressure 13.  He denied chest pain PND orthopnea.  He has been trying to lose some weight.  I discussed with him the importance of trying to exercise at least 5 days/week for at least 30 minutes per [facility] recommendations.  I saw him on May 22, 2022.  At that time, he continued to be on amlodipine 10 mg, lisinopril 40 mg, Toprol tartrate 50 mg twice a day and spironolactone  25 mg daily.  He was also on glimepiride and metformin for diabetes mellitus and atorvastatin 40 mg daily for hyperlipidemia.  LDL cholesterol September 2022 was 75 and when rechecked in April 2023 LDL was 80.  During that evaluation, I obtained a download on his CPAP and he was not very compliant with reference to usage.  His dog often times is waking up at night barking and he would go to a different room and not use CPAP.  His blood pressure was elevated and elected to add low-dose hydralazine initially at 25 mg twice a day to his blood pressure regimen.    I last saw him on August 08, 2022.  At that time his systolic blood pressure was running around 125-130 but his diastolic blood pressure is consistently been elevated in the upper 80s to around 90.  He had developed a headache following initiation of hydralazine and this was discontinued.  During that evaluation I recommended he continue amlodipine 10 mg and lisinopril 40 mg but changed his metoprolol to tartrate from 50 mg twice a day to 100 mg of metoprolol succinate daily I recommended he continue spironolactone 25 mg.  I obtained a new download in the office which showed usage compliance on days but usage greater than 4 hours was suboptimal at only 15 days with average use of 4 hours and 20 minutes.  I discussed the importance of optimal CPAP use of 7 to 9 hours/day.  He continues to be on atorvastatin 40 mg and had plans for follow-up laboratory at his company in April 2024.   Past Medical History:  Diagnosis Date   Diabetes mellitus without complication (HCC)    Hyperlipidemia    Hypertension    Obesity     Past Surgical History:  Procedure Laterality Date   WISDOM TOOTH EXTRACTION  Current Medications: Outpatient Medications Prior to Visit  Medication Sig Dispense Refill   amLODipine (NORVASC) 10 MG tablet Take 1 tablet (10 mg total) by mouth daily. 90 tablet 0   atorvastatin (LIPITOR) 40 MG tablet Take 1 tablet (40 mg total)  by mouth daily. 90 tablet 3   glyBURIDE (DIABETA) 2.5 MG tablet Take 1 tablet (2.5 mg total) by mouth 2 (two) times daily with a meal. (Patient taking differently: Take 2.5 mg by mouth 3 (three) times daily.) 60 tablet 0   lisinopril (ZESTRIL) 40 MG tablet TAKE 1 TABLET BY MOUTH EVERY DAY 90 tablet 1   metFORMIN (GLUCOPHAGE-XR) 500 MG 24 hr tablet Take 500 mg by mouth 4 (four) times daily.     metoprolol succinate (TOPROL-XL) 100 MG 24 hr tablet Take 1 tablet (100 mg total) by mouth daily. Take with or immediately following a meal. 30 tablet 6   spironolactone (ALDACTONE) 25 MG tablet TAKE 1 TABLET (25 MG TOTAL) BY MOUTH DAILY. 90 tablet 1   tiZANidine (ZANAFLEX) 4 MG tablet Take 4 mg by mouth as needed.     zolpidem (AMBIEN) 10 MG tablet Take 5-10 mg by mouth at bedtime as needed.     No facility-administered medications prior to visit.     Allergies:   Patient has no known allergies.   Social History   Socioeconomic History   Marital status: Married    Spouse name: Not on file   Number of children: Not on file   Years of education: Not on file   Highest education level: Not on file  Occupational History   Not on file  Tobacco Use   Smoking status: Never   Smokeless tobacco: Never  Substance and Sexual Activity   Alcohol use: Yes    Alcohol/week: 0.0 standard drinks of alcohol    Comment: socially   Drug use: No   Sexual activity: Not on file  Other Topics Concern   Not on file  Social History Narrative   Not on file   Social Determinants of Health   Financial Resource Strain: Not on file  Food Insecurity: Not on file  Transportation Needs: Not on file  Physical Activity: Not on file  Stress: Not on file  Social Connections: Not on file    Additional social history is notable in that 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  He does travel with work.  He is married for 24 years.  He has 2 children.  His undergraduate college was  at [employer].  He does drink occasional wine and rum.  His exercise has been limited since his knee replacement, but previously had biked.  Family History:  The patient's family history includes Hypertension in his father.  His mother is currently living at age 41.  His father died at 21 but was a smoker and had previously undergone CABG revascularization surgery.  He has 2 brothers, ages 90 and 62 who are alive and well.  He has 2 children, ages 2 and 14.  ROS General: Negative; No fevers, chills, or night sweats; positive for weight gain HEENT: Negative; No changes in vision or hearing, sinus congestion, difficulty swallowing Pulmonary: Negative; No cough, wheezing, shortness of breath, hemoptysis Cardiovascular: See history of present illness GI: Negative; No nausea, vomiting, diarrhea, or abdominal pain GU: Negative; No dysuria, hematuria, or difficulty voiding Musculoskeletal: Status post right knee replacement in January 2018. Status post recent left knee surgery 04/17/2017 Hematologic/Oncology:  Negative; no easy bruising, bleeding Endocrine: Negative; no heat/cold intolerance; no diabetes Neuro: Negative; no changes in balance, headaches Skin: Negative; No rashes or skin lesions Psychiatric: Negative; No behavioral problems, depression Sleep: Positive for OSA, CPAP set up March 18, 2018.  Initial DME company [employer], now [employer] Other comprehensive 14 point system review is negative.   PHYSICAL EXAM:   VS:  BP (!) 142/80   Pulse 65   Ht 6\' 2"  (1.88 m)   Wt 277 lb (125.6 kg)   SpO2 98%   BMI 35.56 kg/m     Repeat blood pressure by me was 120/78.  He tells me his blood pressure at home typically runs around 126 over low 80s.  Wt Readings from Last 3 Encounters:  12/21/22 277 lb (125.6 kg)  08/08/22 280 lb 3.7 oz (127.1 kg)  05/22/22 280 lb 9.6 oz (127.3 kg)    Weight at office visit with me in 2019 was 276.  General: Alert, oriented, no distress.  Skin: normal turgor,  no rashes, warm and dry HEENT: Normocephalic, atraumatic. Pupils equal round and reactive to light; sclera anicteric; extraocular muscles intact;  Nose without nasal septal hypertrophy Mouth/Parynx benign; Mallinpatti scale 3 Neck: No JVD, no carotid bruits; normal carotid upstroke Lungs: clear to ausculatation and percussion; no wheezing or rales Chest wall: without tenderness to palpitation Heart: PMI not displaced, RRR, s1 s2 normal, 1/6 systolic murmur, no diastolic murmur, no rubs, gallops, thrills, or heaves Abdomen: soft, nontender; no hepatosplenomehaly, BS+; abdominal aorta nontender and not dilated by palpation. Back: no CVA tenderness Pulses 2+ Musculoskeletal: full range of motion, normal strength, no joint deformities Extremities: no clubbing cyanosis or edema, Homan's sign negative  Neurologic: grossly nonfocal; Cranial nerves grossly wnl Psychologic: Normal mood and affect  Studies/Labs Reviewed:   Dec 21, 2022 ECG (independently read by me): Sinus bradycardia at 58  August 08, 2022 ECG (independently read by me): NSR at 76, baseline artifact  May 22, 2022 ECG (independently read by me): NSR at 63, 1st degree AV block; PR 210 msec  May 24, 2021 ECG (independently read by me): NSR at 70, no ectopy  May 2019 ECG (independently read by me): Normal sinus rhythm at 60 bpm.  PR interval 200 ms.  No ST segment changes.  October 2018 EKG:  EKG is ordered today.  Normal sinus rhythm at 72 bpm.  No ectopy.  Normal intervals.  Q wave in 3.  August 2018 ECG (independently read by me): Normal sinus rhythm at 67 bpm.  Borderline first-degree AV block with a PR interval of 206 ms.  TC interval normal at 388 ms.  No ST segment changes.  Small Q wave in lead 3, nondiagnostic  July 2018 ECG (independently read by me): Normal sinus rhythm at 74 bpm.  Normal intervals.  No ST segment changes.  No ectopy.  May 2018 ECG (independently read by me): Normal sinus rhythm at 66 bpm.   Borderline first degree AV block with a PR interval at 204 ms.  No ST segment changes.  Nondiagnostic Q wave in lead 3.  Recent Labs:    Latest Ref Rng & Units 05/19/2021    9:23 AM 06/14/2017    8:26 AM 04/05/2017   12:07 PM  BMP  Glucose 70 - 99 mg/dL 401  027  79   BUN 6 - 24 mg/dL 15  18  18    Creatinine 0.76 - 1.27 mg/dL 2.53  6.64  4.03   BUN/Creat Ratio 9 -  20 15  16  15    Sodium 134 - 144 mmol/L 138  139  143   Potassium 3.5 - 5.2 mmol/L 4.6  4.7  4.8   Chloride 96 - 106 mmol/L 102  102  105   CO2 20 - 29 mmol/L 20  22  21    Calcium 8.7 - 10.2 mg/dL 16.1  9.8  9.9         Latest Ref Rng & Units 05/19/2021    9:23 AM 03/29/2017   11:13 AM 02/14/2017    8:38 AM  Hepatic Function  Total Protein 6.0 - 8.5 g/dL 6.8  7.1  6.6   Albumin 3.8 - 4.9 g/dL 4.4  4.4  4.2   AST 0 - 40 IU/L 27  23  24    ALT 0 - 44 IU/L 50  39  54   Alk Phosphatase 44 - 121 IU/L 88  86  68   Total Bilirubin 0.0 - 1.2 mg/dL 0.5  0.6  0.6        Latest Ref Rng & Units 02/14/2017    8:38 AM  CBC  WBC 3.4 - 10.8 x10E3/uL 6.2   Hemoglobin 13.0 - 17.7 g/dL 09.6   Hematocrit 04.5 - 51.0 % 43.1   Platelets 150 - 379 x10E3/uL 262    Lab Results  Component Value Date   MCV 86 02/14/2017   Lab Results  Component Value Date   TSH 1.880 02/14/2017   No results found for: "HGBA1C"   BNP No results found for: "BNP"  ProBNP No results found for: "PROBNP"   Lipid Panel     Component Value Date/Time   CHOL 148 05/19/2021 0923   TRIG 122 05/19/2021 0923   HDL 51 05/19/2021 0923   CHOLHDL 2.9 05/19/2021 0923   LDLCALC 75 05/19/2021 0923     RADIOLOGY: No results found.   Additional studies/ records that were reviewed today include:  I reviewed the records from [facility] including office evaluations as well as laboratory.  ECHO Study Conclusions: 01/29/17   - Left ventricle: The cavity size was normal. Wall thickness was   increased in a pattern of moderate LVH. Systolic function was    normal. The estimated ejection fraction was in the range of 60%   to 65%. Wall motion was normal; there were no regional wall   motion abnormalities. There was no evidence of elevated   ventricular filling pressure by Doppler parameters. - Aortic valve: Trileaflet; mildly thickened, mildly calcified   leaflets. - Aorta: Aortic root dimension: 43 mm (ED). - Ascending aorta: The ascending aorta was mildly dilated. - Left atrium: The atrium was mildly dilated. - Right ventricle: The cavity size was mildly dilated. Wall   thickness was normal.   05/10/2022 CLINICAL DATA:  Thoracic aortic aneurysm without rupture   EXAM: CT ANGIOGRAPHY CHEST WITH CONTRAST   TECHNIQUE: Multidetector CT imaging of the chest was performed using the standard protocol during bolus administration of intravenous contrast. Multiplanar CT image reconstructions and MIPs were obtained to evaluate the vascular anatomy.   RADIATION DOSE REDUCTION: This exam was performed according to the departmental dose-optimization program which includes automated exposure control, adjustment of the mA and/or kV according to patient size and/or use of iterative reconstruction technique.   CONTRAST:  OMNIPAQUE IOHEXOL 350 MG/ML SOLN   COMPARISON:  06/06/2021 and previous   FINDINGS: Cardiovascular: Heart size normal. No pericardial effusion. Satisfactory opacification of pulmonary arteries noted, and there is no evidence of  pulmonary emboli. Scattered coronary calcifications. Good contrast opacification of the thoracic aorta. No dissection or stenosis. 3-vessel brachiocephalic arterial origin anatomy without proximal stenosis.   Aortic Root:   --Valve: 2.9 cm   --Sinuses: 4.2 cm   --Sinotubular Junction: 3.9 cm   Limitations by motion: Mild   Thoracic Aorta:   --Ascending Aorta: 3.6 cm   --Aortic Arch: 3.5 cm (previously 3.4 cm)   --Descending Aorta: 2.9 cm   Mediastinum/Nodes: No mass or  adenopathy.   Lungs/Pleura: No pleural effusion. No pneumothorax. Somewhat geographic ground-glass pulmonary opacities presumably related to mild atelectasis. Previously noted 7 mm subpleural nodule in the medial basal segment right lower lobe is partially obscured by dependent atelectasis. No new nodule or confluent airspace disease.   Upper Abdomen: Fatty liver.  No acute findings.   Musculoskeletal: Anterior vertebral endplate spurring at multiple levels in the mid and lower thoracic spine.   Review of the MIP images confirms the above findings.   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 Recommend annual imaging followup by CTA or MRA. This recommendation follows 2010 ACCF/AHA/AATS/ACR/ASA/SCA/SCAI/SIR/STS/SVM Guidelines for the Diagnosis and Management of Patients with Thoracic Aortic Disease. Circulation.2010; 121: W119-J478 2. Fatty liver    ASSESSMENT:    1. Essential hypertension   2. OSA (obstructive sleep apnea)   3. Hyperlipidemia LDL goal <70   4. Obesity, Class II, BMI 35-39.9   5. Type 2 diabetes mellitus without complication, without long-term current use of insulin [facility])     PLAN:  Mr. Treyce Queener is a 57 year-old male who has a history of obesity and at least a 20 year history of hypertension and 7 year history of type 2 diabetes mellitus.  Despite medical management, his blood pressure continued to be elevated which led to his initial evaluation with me on 01/15/2017.  When I initially saw him, he already was on amlodipine 10 mg, lisinopril HCT 40/25 mg, metoprolol 50 mg twice a day and I recommended the addition of spironolactone initially at 12.5 mg daily.  When I saw him in follow-up, he felt improved, but due to slight continued blood pressure elevation spironolactone further titrated to 25 mg prior to his knee surgery.  At that time he was on diclofenac and combination lisinopril HCT.  Laboratory at Dr. Sherene Sires office had shown an  increasing creatinine to 1.5.  With discontinuance of diclofenac and changing him to just lisinopril alone renal function improved.  Subsequent creatinine was 1.23.  After not having seen him in over 3 years he had gained over 20 pounds during the COVID pandemic.  He also was having issues with sleep apnea and was inconsistent with CPAP use.  At most recent evaluation, medications were adjusted.  His blood pressure today is improved and on repeat by me 120/78 on his regimen of amlodipine 10 mg, lisinopril 40 mg, metoprolol succinate 100 mg in addition to spironolactone 25 mg daily.  He is diabetic on glimepiride in addition to metformin.  He is on atorvastatin 40 mg for hyperlipidemia.  He has been using CPAP.  I obtained a download from April 1 through December 18, 2022.  Usage days was 83%.  Average use at improved to 6 hours and 27 minutes.  His pressure set at a range of 13 to 20 cm and a is excellent at 0.6.  At the beginning of CPAP he feels that he may not be getting enough air.  As result I will turn off his ramp which was  starting at a pressure of 6 cm.  He feels well.  BMI is still consistent with moderate obesity at 35.56.  He will be traveling to [place] for work.  He will be obtaining repeat laboratory at [facility] in 2 weeks.  He has issues with sciatica and plantar fasciitis.  I will see him in 6 months for follow-up evaluation or sooner as needed.   Medication Adjustments/Labs and Tests Ordered: Current medicines are reviewed at length with the patient today.  Concerns regarding medicines are outlined above.  Medication changes, Labs and Tests ordered today are listed in the Patient Instructions below. Patient Instructions  Medication Instructions:  Your physician recommends that you continue on your current medications as directed. Please refer to the Current Medication list given to you today.  *If you need a refill on your cardiac medications before your next appointment, please call your  pharmacy*   Lab Work: None ordered   If you have labs (blood work) drawn today and your tests are completely normal, you will receive your results only by: MyChart Message (if you have MyChart) OR A paper copy in the mail If you have any lab test that is abnormal or we need to change your treatment, we will call you to review the results.   Testing/Procedures: None ordered    Follow-Up: At [facility], you and your health needs are our priority.  As part of our continuing mission to provide you with exceptional heart care, we have created designated Provider Care Teams.  These Care Teams include your primary Cardiologist (physician) and Advanced Practice Providers (APPs -  Physician Assistants and Nurse Practitioners) who all work together to provide you with the care you need, when you need it.  We recommend signing up for the patient portal called "MyChart".  Sign up information is provided on this After Visit Summary.  MyChart is used to connect with patients for Virtual Visits (Telemedicine).  Patients are able to view lab/test results, encounter notes, upcoming appointments, etc.  Non-urgent messages can be sent to your provider as well.   To learn more about what you can do with MyChart, go to [email or web address].    Your next appointment:   6 month(s)  Provider:   Nicki Guadalajara, MD     Other Instructions     Signed, Nicki Guadalajara, MD  12/23/2022 2:01 PM    [facility] [address], [address], [place], [place]  [address] Phone: [phone]

## 2022-12-21 NOTE — Patient Instructions (Signed)
Medication Instructions:  Your physician recommends that you continue on your current medications as directed. Please refer to the Current Medication list given to you today.  *If you need a refill on your cardiac medications before your next appointment, please call your pharmacy*   Lab Work: None ordered   If you have labs (blood work) drawn today and your tests are completely normal, you will receive your results only by: MyChart Message (if you have MyChart) OR A paper copy in the mail If you have any lab test that is abnormal or we need to change your treatment, we will call you to review the results.   Testing/Procedures: None ordered    Follow-Up: At [facility], you and your health needs are our priority.  As part of our continuing mission to provide you with exceptional heart care, we have created designated Provider Care Teams.  These Care Teams include your primary Cardiologist (physician) and Advanced Practice Providers (APPs -  Physician Assistants and Nurse Practitioners) who all work together to provide you with the care you need, when you need it.  We recommend signing up for the patient portal called "MyChart".  Sign up information is provided on this After Visit Summary.  MyChart is used to connect with patients for Virtual Visits (Telemedicine).  Patients are able to view lab/test results, encounter notes, upcoming appointments, etc.  Non-urgent messages can be sent to your provider as well.   To learn more about what you can do with MyChart, go to [email or web address].    Your next appointment:   6 month(s)  Provider:   Thomas Kelly, MD     Other Instructions   

## 2022-12-23 ENCOUNTER — Encounter: Payer: Self-pay | Admitting: Cardiovascular Disease

## 2022-12-24 ENCOUNTER — Other Ambulatory Visit: Payer: Self-pay | Admitting: Cardiovascular Disease

## 2022-12-25 MED ORDER — AMLODIPINE BESYLATE 10 MG PO TABS: 10.0000 mg | ORAL_TABLET | Freq: Every day | ORAL | 3 refills | Status: DC

## 2022-12-25 NOTE — Telephone Encounter (Signed)
Prescription was e-sent to pharmacy

## 2023-02-26 ENCOUNTER — Other Ambulatory Visit: Payer: Self-pay | Admitting: Cardiovascular Disease

## 2023-04-30 ENCOUNTER — Other Ambulatory Visit: Payer: Self-pay | Admitting: Orthopedic Surgery

## 2023-05-01 ENCOUNTER — Telehealth: Payer: Self-pay | Admitting: *Deleted

## 2023-05-01 NOTE — Telephone Encounter (Signed)
   Pre-operative Risk Assessment    Patient Name: Gregory Khan  DOB: 05-Jan-1966 MRN: [id number]   DATE OF LAST VISIT:  12/21/22 DR. KELLY DATE OF NEXT VISIT: 06/27/23 DR. KELLY 6 MONTH FOLLOW UP  Request for Surgical Clearance    Procedure:   LEFT-SIDED LUMBAR 4-5 MICRODISCECTOMY  Date of Surgery:  Clearance 05/15/23                                 Surgeon:  DR. MARK DUMONSKI Surgeon's Group or Practice Name:  [facility]  Phone number:  [phone] ATTN: Tobey Bride Fax number:  [phone]   Type of Clearance Requested:   - Medical ; NO MEDICATIONS LISTED AS NEEDING TO BE HELD   Type of Anesthesia:  General    Additional requests/questions:    Gregory Khan   05/01/2023, 4:40 PM

## 2023-05-01 NOTE — Telephone Encounter (Signed)
   Name: Gregory Khan  DOB: 24-Jun-1966  MRN: [id number]  Primary Cardiologist: Nicki Guadalajara, MD   Preoperative team, please contact this patient and set up a phone call appointment for further preoperative risk assessment. Please obtain consent and complete medication review. Thank you for your help.  I confirm that guidance regarding antiplatelet and oral anticoagulation therapy has been completed and, if necessary, noted below.  None requested    Ronney Asters, NP 05/01/2023, 4:51 PM  [facility]

## 2023-05-01 NOTE — Telephone Encounter (Signed)
Will need to confirm with pre op APP where to add on as all regular time slots are full. Will need the ok to use provider slot per protocol.

## 2023-05-02 ENCOUNTER — Telehealth: Payer: Self-pay | Admitting: *Deleted

## 2023-05-02 NOTE — Telephone Encounter (Signed)
Pt has been scheduled a tele visit, 05/06/23 10:20. Consent on file / medications have been reconciled.      Patient Consent for Virtual Visit        Gregory Khan has provided verbal consent on 05/02/2023 for a virtual visit (video or telephone).   CONSENT FOR VIRTUAL VISIT FOR:  Gregory Khan  By participating in this virtual visit I agree to the following:  I hereby voluntarily request, consent and authorize [facility] and its employed or contracted physicians, physician assistants, nurse practitioners or other licensed health care professionals (the Practitioner), to provide me with telemedicine health care services (the "Services") as deemed necessary by the treating Practitioner. I acknowledge and consent to receive the Services by the Practitioner via telemedicine. I understand that the telemedicine visit will involve communicating with the Practitioner through live audiovisual communication technology and the disclosure of certain medical information by electronic transmission. I acknowledge that I have been given the opportunity to request an in-person assessment or other available alternative prior to the telemedicine visit and am voluntarily participating in the telemedicine visit.  I understand that I have the right to withhold or withdraw my consent to the use of telemedicine in the course of my care at any time, without affecting my right to future care or treatment, and that the Practitioner or I may terminate the telemedicine visit at any time. I understand that I have the right to inspect all information obtained and/or recorded in the course of the telemedicine visit and may receive copies of available information for a reasonable fee.  I understand that some of the potential risks of receiving the Services via telemedicine include:  Delay or interruption in medical evaluation due to technological equipment failure or disruption; Information transmitted may not be  sufficient (e.g. poor resolution of images) to allow for appropriate medical decision making by the Practitioner; and/or  In rare instances, security protocols could fail, causing a breach of personal health information.  Furthermore, I acknowledge that it is my responsibility to provide information about my medical history, conditions and care that is complete and accurate to the best of my ability. I acknowledge that Practitioner's advice, recommendations, and/or decision may be based on factors not within their control, such as incomplete or inaccurate data provided by me or distortions of diagnostic images or specimens that may result from electronic transmissions. I understand that the practice of medicine is not an exact science and that Practitioner makes no warranties or guarantees regarding treatment outcomes. I acknowledge that a copy of this consent can be made available to me via my patient portal [facility] MyChart), or I can request a printed copy by calling the office of [facility].    I understand that my insurance will be billed for this visit.   I have read or had this consent read to me. I understand the contents of this consent, which adequately explains the benefits and risks of the Services being provided via telemedicine.  I have been provided ample opportunity to ask questions regarding this consent and the Services and have had my questions answered to my satisfaction. I give my informed consent for the services to be provided through the use of telemedicine in my medical care

## 2023-05-02 NOTE — Telephone Encounter (Signed)
Pt has been scheduled a tele visit, 05/06/23 10:20. Consent on file / medications have been reconciled.

## 2023-05-06 ENCOUNTER — Ambulatory Visit: Payer: 59 | Attending: Physician Assistant

## 2023-05-06 DIAGNOSIS — Z01818 Encounter for other preprocedural examination: Secondary | ICD-10-CM

## 2023-05-06 DIAGNOSIS — Z01810 Encounter for preprocedural cardiovascular examination: Secondary | ICD-10-CM | POA: Diagnosis not present

## 2023-05-06 NOTE — Progress Notes (Signed)
Virtual Visit via Telephone Note   Because of Gregory Khan's co-morbid illnesses, he is at least at moderate risk for complications without adequate follow up.  This format is felt to be most appropriate for this patient at this time.  The patient did not have access to video technology/had technical difficulties with video requiring transitioning to audio format only (telephone).  All issues noted in this document were discussed and addressed.  No physical exam could be performed with this format.  Please refer to the patient's chart for his consent to telehealth for [facility].  Evaluation Performed:  Preoperative cardiovascular risk assessment _____________   Date:  05/06/2023   Patient ID:  Gregory Khan, DOB 12/11/1965, MRN [id number] Patient Location:  Home Provider location:   Office  Primary Care Provider:  Ozella Rocks, MD Primary Cardiologist:  Nicki Guadalajara, MD  Chief Complaint / Patient Profile   57 y.o. y/o male with a h/o essential HTN, OSA, HLD, Obesity, type 2 DM who is pending left-sided lumbar 4-5 microdiscectomy 05/15/2023 and presents today for telephonic preoperative cardiovascular risk assessment.  History of Present Illness    Gregory Khan is a 57 y.o. male who presents via audio/video conferencing for a telehealth visit today.  Pt was last seen in cardiology clinic on 12/21/2022 by Dr. Tresa Endo.  At that time Gregory Khan was doing well other than difficult blood pressure control.  The patient is now pending procedure as outlined above. Since his last visit, he has been doing well from a cardiac standpoint.  Blood pressure is much better controlled as of late 126/85 has been average.  He is struggled with sciatica and having foot drop due to his issues with his back.  However, he was recently in [place] a few weeks ago and was doing 15,000 steps a day.  He bought a strap that was helpful with his foot drop.  He has remained active.  Does meet 4  METS.   No medications indicated as needing held.   Past Medical History    Past Medical History:  Diagnosis Date   Diabetes mellitus without complication (HCC)    Hyperlipidemia    Hypertension    Obesity    Past Surgical History:  Procedure Laterality Date   WISDOM TOOTH EXTRACTION      Allergies  No Known Allergies  Home Medications    Prior to Admission medications   Medication Sig Start Date End Date Taking? Authorizing Provider  acetaminophen (TYLENOL) 500 MG tablet Take 1,000 mg by mouth every 6 (six) hours as needed for moderate pain.    [provider]  amLODipine (NORVASC) 10 MG tablet Take 1 tablet (10 mg total) by mouth daily. 12/25/22   Lennette Bihari, MD  atorvastatin (LIPITOR) 40 MG tablet Take 1 tablet (40 mg total) by mouth daily. 11/05/22   Lennette Bihari, MD  glyBURIDE (DIABETA) 2.5 MG tablet Take 1 tablet (2.5 mg total) by mouth 2 (two) times daily with a meal. Patient taking differently: Take 2.5 mg by mouth See admin instructions. Take 2.5 mg twice daily, may take a third 2.5 mg tablet as needed for high blood sugar 09/13/17   Lennette Bihari, MD  lisinopril (ZESTRIL) 40 MG tablet TAKE 1 TABLET BY MOUTH EVERY DAY 11/26/22   Lennette Bihari, MD  metFORMIN (GLUCOPHAGE-XR) 500 MG 24 hr tablet Take 1,000 mg by mouth 2 (two) times daily.    [provider]  metoprolol  succinate (TOPROL-XL) 100 MG 24 hr tablet TAKE 1 TABLET BY MOUTH DAILY. TAKE WITH OR IMMEDIATELY FOLLOWING A MEAL. 02/26/23   Lennette Bihari, MD  spironolactone (ALDACTONE) 25 MG tablet TAKE 1 TABLET (25 MG TOTAL) BY MOUTH DAILY. 11/26/22   Lennette Bihari, MD  tiZANidine (ZANAFLEX) 4 MG tablet Take 4 mg by mouth as needed for muscle spasms. 12/16/22   [provider]    Physical Exam    Vital Signs:  Gregory Khan does not have vital signs available for review today.  Given telephonic nature of communication, physical exam is limited. AAOx3. NAD. Normal affect.  Speech  and respirations are unlabored.  Accessory Clinical Findings    None  Assessment & Plan    1.  Preoperative Cardiovascular Risk Assessment:  Mr. Ziel perioperative risk of a major cardiac event is 0.9% according to the Revised Cardiac Risk Index (RCRI).  Therefore, he is at low risk for perioperative complications.   His functional capacity is fair at 5.07 METs according to the Duke Activity Status Index (DASI). Recommendations: According to ACC/AHA guidelines, no further cardiovascular testing needed.  The patient may proceed to surgery at acceptable risk.     Time:   Today, I have spent 7 minutes with the patient with telehealth technology discussing medical history, symptoms, and management plan.     Sharlene Dory, PA-C  05/06/2023, 10:39 AM

## 2023-05-08 NOTE — Pre-Procedure Instructions (Signed)
Surgical Instructions   Your procedure is scheduled on May 15, 2023. Report to [facility] Main Entrance "A" at 7:00 A.M., then check in with the Admitting office. Any questions or running late day of surgery: call [phone]  Questions prior to your surgery date: call [phone], Monday-Friday, 8am-4pm. If you experience any cold or flu symptoms such as cough, fever, chills, shortness of breath, etc. between now and your scheduled surgery, please notify us at the above number.     Remember:  Do not eat after midnight the night before your surgery  You may drink clear liquids until 7:00 AM the morning of your surgery.   Clear liquids allowed are: Water, Non-Citrus Juices (without pulp), Carbonated Beverages, Clear Tea, Black Coffee Only (NO MILK, CREAM OR POWDERED CREAMER of any kind), and Gatorade.  Patient Instructions  The night before surgery:  No food after midnight. ONLY clear liquids after midnight  The day of surgery (if you have diabetes): Drink ONE (1) 12 oz G2 given to you in your pre admission testing appointment by 7:00 AM the morning of surgery. Drink in one sitting. Do not sip.  This drink was given to you during your hospital  pre-op appointment visit.  Nothing else to drink after completing the  12 oz bottle of G2.         If you have questions, please contact your surgeon's office.     Take these medicines the morning of surgery with A SIP OF WATER: amLODipine (NORVASC)  atorvastatin (LIPITOR)  metoprolol succinate (TOPROL-XL)    May take these medicines IF NEEDED: acetaminophen (TYLENOL)  tiZANidine (ZANAFLEX)    One week prior to surgery, STOP taking any Aspirin (unless otherwise instructed by your surgeon) Aleve, Naproxen, Ibuprofen, Motrin, Advil, Goody's, BC's, all herbal medications, fish oil, and non-prescription vitamins.   WHAT DO I DO ABOUT MY DIABETES MEDICATION?   Do not take metFORMIN (GLUCOPHAGE-XR) the morning of  surgery.  Do not take glyBURIDE (DIABETA) the evening before surgery or the morning of surgery       HOW TO MANAGE YOUR DIABETES BEFORE AND AFTER SURGERY  Why is it important to control my blood sugar before and after surgery? Improving blood sugar levels before and after surgery helps healing and can limit problems. A way of improving blood sugar control is eating a healthy diet by:  Eating less sugar and carbohydrates  Increasing activity/exercise  Talking with your doctor about reaching your blood sugar goals High blood sugars (greater than 180 mg/dL) can raise your risk of infections and slow your recovery, so you will need to focus on controlling your diabetes during the weeks before surgery. Make sure that the doctor who takes care of your diabetes knows about your planned surgery including the date and location.  How do I manage my blood sugar before surgery? Check your blood sugar at least 4 times a day, starting 2 days before surgery, to make sure that the level is not too high or low.  Check your blood sugar the morning of your surgery when you wake up and every 2 hours until you get to the Short Stay unit.  If your blood sugar is less than 70 mg/dL, you will need to treat for low blood sugar: Do not take insulin. Treat a low blood sugar (less than 70 mg/dL) with  cup of clear juice (cranberry or apple), 4 glucose tablets, OR glucose gel. Recheck blood sugar in 15 minutes after treatment (to make sure it  is greater than 70 mg/dL). If your blood sugar is not greater than 70 mg/dL on recheck, call [phone] for further instructions. Report your blood sugar to the short stay nurse when you get to Short Stay.  If you are admitted to the hospital after surgery: Your blood sugar will be checked by the staff and you will probably be given insulin after surgery (instead of oral diabetes medicines) to make sure you have good blood sugar levels. The goal for blood sugar control  after surgery is 80-180 mg/dL.                      Do NOT Smoke (Tobacco/Vaping) for 24 hours prior to your procedure.  If you use a CPAP at night, you may bring your mask/headgear for your overnight stay.   You will be asked to remove any contacts, glasses, piercing's, hearing aid's, dentures/partials prior to surgery. Please bring cases for these items if needed.    Patients discharged the day of surgery will not be allowed to drive home, and someone needs to stay with them for 24 hours.  SURGICAL WAITING ROOM VISITATION Patients may have no more than 2 support people in the waiting area - these visitors may rotate.   Pre-op nurse will coordinate an appropriate time for 1 ADULT support person, who may not rotate, to accompany patient in pre-op.  Children under the age of 63 must have an adult with them who is not the patient and must remain in the main waiting area with an adult.  If the patient needs to stay at the hospital during part of their recovery, the visitor guidelines for inpatient rooms apply.  Please refer to the [facility] website for the visitor guidelines for any additional information.   If you received a COVID test during your pre-op visit  it is requested that you wear a mask when out in public, stay away from anyone that may not be feeling well and notify your surgeon if you develop symptoms. If you have been in contact with anyone that has tested positive in the last 10 days please notify you surgeon.      Pre-operative 5 CHG Bathing Instructions   You can play a key role in reducing the risk of infection after surgery. Your skin needs to be as free of germs as possible. You can reduce the number of germs on your skin by washing with CHG (chlorhexidine gluconate) soap before surgery. CHG is an antiseptic soap that kills germs and continues to kill germs even after washing.   DO NOT use if you have an allergy to chlorhexidine/CHG or antibacterial soaps. If your  skin becomes reddened or irritated, stop using the CHG and notify one of our RNs at [phone].   Please shower with the CHG soap starting 4 days before surgery using the following schedule:     Please keep in mind the following:  DO NOT shave, including legs and underarms, starting the day of your first shower.   You may shave your face at any point before/day of surgery.  Place clean sheets on your bed the day you start using CHG soap. Use a clean washcloth (not used since being washed) for each shower. DO NOT sleep with pets once you start using the CHG.   CHG Shower Instructions:  Wash your face and private area with normal soap. If you choose to wash your hair, wash first with your normal shampoo.  After you use  shampoo/soap, rinse your hair and body thoroughly to remove shampoo/soap residue.  Turn the water OFF and apply about 3 tablespoons (45 ml) of CHG soap to a CLEAN washcloth.  Apply CHG soap ONLY FROM YOUR NECK DOWN TO YOUR TOES (washing for 3-5 minutes)  DO NOT use CHG soap on face, private areas, open wounds, or sores.  Pay special attention to the area where your surgery is being performed.  If you are having back surgery, having someone wash your back for you may be helpful. Wait 2 minutes after CHG soap is applied, then you may rinse off the CHG soap.  Pat dry with a clean towel  Put on clean clothes/pajamas   If you choose to wear lotion, please use ONLY the CHG-compatible lotions on the back of this paper.   Additional instructions for the day of surgery: DO NOT APPLY any lotions, deodorants, cologne, or perfumes.   Do not bring valuables to the hospital. [facility] is not responsible for any belongings/valuables. Do not wear nail polish, gel polish, artificial nails, or any other type of covering on natural nails (fingers and toes) Do not wear jewelry or makeup Put on clean/comfortable clothes.  Please brush your teeth.  Ask your nurse before applying any  prescription medications to the skin.     CHG Compatible Lotions   Aveeno Moisturizing lotion  Cetaphil Moisturizing Cream  Cetaphil Moisturizing Lotion  Clairol Herbal Essence Moisturizing Lotion, Dry Skin  Clairol Herbal Essence Moisturizing Lotion, Extra Dry Skin  Clairol Herbal Essence Moisturizing Lotion, Normal Skin  Curel Age Defying Therapeutic Moisturizing Lotion with Alpha Hydroxy  Curel Extreme Care Body Lotion  Curel Soothing Hands Moisturizing Hand Lotion  Curel Therapeutic Moisturizing Cream, Fragrance-Free  Curel Therapeutic Moisturizing Lotion, Fragrance-Free  Curel Therapeutic Moisturizing Lotion, Original Formula  Eucerin Daily Replenishing Lotion  Eucerin Dry Skin Therapy Plus Alpha Hydroxy Crme  Eucerin Dry Skin Therapy Plus Alpha Hydroxy Lotion  Eucerin Original Crme  Eucerin Original Lotion  Eucerin Plus Crme Eucerin Plus Lotion  Eucerin TriLipid Replenishing Lotion  Keri Anti-Bacterial Hand Lotion  Keri Deep Conditioning Original Lotion Dry Skin Formula Softly Scented  Keri Deep Conditioning Original Lotion, Fragrance Free Sensitive Skin Formula  Keri Lotion Fast Absorbing Fragrance Free Sensitive Skin Formula  Keri Lotion Fast Absorbing Softly Scented Dry Skin Formula  Keri Original Lotion  Keri Skin Renewal Lotion Keri Silky Smooth Lotion  Keri Silky Smooth Sensitive Skin Lotion  Nivea Body Creamy Conditioning Oil  Nivea Body Extra Enriched Lotion  Nivea Body Original Lotion  Nivea Body Sheer Moisturizing Lotion Nivea Crme  Nivea Skin Firming Lotion  NutraDerm 30 Skin Lotion  NutraDerm Skin Lotion  NutraDerm Therapeutic Skin Cream  NutraDerm Therapeutic Skin Lotion  ProShield Protective Hand Cream  Provon moisturizing lotion  Please read over the following fact sheets that you were given.

## 2023-05-09 ENCOUNTER — Encounter (HOSPITAL_COMMUNITY): Payer: Self-pay

## 2023-05-09 ENCOUNTER — Other Ambulatory Visit: Payer: Self-pay

## 2023-05-09 ENCOUNTER — Encounter (HOSPITAL_COMMUNITY)
Admission: RE | Admit: 2023-05-09 | Discharge: 2023-05-09 | Disposition: A | Discharge status: Home / Self Care | Payer: 59 | Source: Ambulatory Visit | Attending: Orthopedic Surgery | Admitting: Orthopedic Surgery

## 2023-05-09 VITALS — BP 156/101 | HR 69 | Temp 98.3°F | Resp 18 | Ht 73.0 in | Wt 267.5 lb

## 2023-05-09 DIAGNOSIS — G4733 Obstructive sleep apnea (adult) (pediatric): Secondary | ICD-10-CM | POA: Insufficient documentation

## 2023-05-09 DIAGNOSIS — E119 Type 2 diabetes mellitus without complications: Secondary | ICD-10-CM | POA: Diagnosis not present

## 2023-05-09 DIAGNOSIS — Z01812 Encounter for preprocedural laboratory examination: Secondary | ICD-10-CM | POA: Diagnosis not present

## 2023-05-09 DIAGNOSIS — E785 Hyperlipidemia, unspecified: Secondary | ICD-10-CM | POA: Insufficient documentation

## 2023-05-09 DIAGNOSIS — I10 Essential (primary) hypertension: Secondary | ICD-10-CM | POA: Diagnosis not present

## 2023-05-09 DIAGNOSIS — Z01818 Encounter for other preprocedural examination: Secondary | ICD-10-CM | POA: Diagnosis present

## 2023-05-09 HISTORY — DX: Unspecified osteoarthritis, unspecified site: M19.90

## 2023-05-09 LAB — CBC
HCT: 44.8 % (ref 39.0–52.0)
Hemoglobin: 14.6 g/dL (ref 13.0–17.0)
MCH: 29.9 pg (ref 26.0–34.0)
MCHC: 32.6 g/dL (ref 30.0–36.0)
MCV: 91.8 fL (ref 80.0–100.0)
Platelets: 344 10*3/uL (ref 150–400)
RBC: 4.88 MIL/uL (ref 4.22–5.81)
RDW: 13.9 % (ref 11.5–15.5)
WBC: 7 10*3/uL (ref 4.0–10.5)
nRBC: 0 % (ref 0.0–0.2)

## 2023-05-09 LAB — HEMOGLOBIN A1C
Hgb A1c MFr Bld: 7 % — ABNORMAL HIGH (ref 4.8–5.6)
Mean Plasma Glucose: 154.2 mg/dL

## 2023-05-09 LAB — BASIC METABOLIC PANEL
Anion gap: 13 (ref 5–15)
BUN: 18 mg/dL (ref 6–20)
CO2: 24 mmol/L (ref 22–32)
Calcium: 11.3 mg/dL — ABNORMAL HIGH (ref 8.9–10.3)
Chloride: 98 mmol/L (ref 98–111)
Creatinine, Ser: 1.26 mg/dL — ABNORMAL HIGH (ref 0.61–1.24)
GFR, Estimated: >60 mL/min (ref >60)
Glucose, Bld: 134 mg/dL — ABNORMAL HIGH (ref 70–99)
Potassium: 4.4 mmol/L (ref 3.5–5.1)
Sodium: 135 mmol/L (ref 135–145)

## 2023-05-09 LAB — SURGICAL PCR SCREEN
MRSA, PCR: NEGATIVE
Staphylococcus aureus: POSITIVE — AB

## 2023-05-09 LAB — GLUCOSE, CAPILLARY: Glucose-Capillary: 137 mg/dL — ABNORMAL HIGH (ref 70–99)

## 2023-05-09 NOTE — Progress Notes (Signed)
PCP - Dr. Thomes Dinning Cardiologist - Dr. Nicki Guadalajara  PPM/ICD - Denies Device Orders - n/a Rep Notified - n/a  Chest x-ray - Denies EKG - 12/21/2022 Stress Test - Denies ECHO - 01/29/2017 Cardiac Cath - Denies  Sleep Study - +OSA. Pt wears CPAP some nights. He does not know pressure settings.  Pt is DM2. He checks his blood sugar 2-3x/day. Normal fasting range is around 100. CBG at pre-op appointment 137. A1c result pending.  Last dose of GLP1 agonist- n/a GLP1 instructions: n/a  Blood Thinner Instructions: n/a Aspirin Instructions: n/a  ERAS Protcol - Clear liquids until 0700 morning of surgery PRE-SURGERY Ensure or G2- G2 given to pt with instructions  COVID TEST- n/a   Anesthesia review: Yes. Cardiac Clearance.   Patient denies shortness of breath, fever, cough and chest pain at PAT appointment. Pt denies any respiratory illness/infection in the last two months.   All instructions explained to the patient, with a verbal understanding of the material. Patient agrees to go over the instructions while at home for a better understanding. Patient also instructed to self quarantine after being tested for COVID-19. The opportunity to ask questions was provided.

## 2023-05-10 NOTE — Anesthesia Preprocedure Evaluation (Signed)
Anesthesia Evaluation  Patient identified by MRN, date of birth, ID band Patient awake    Reviewed: Allergy & Precautions, NPO status , Patient's Chart, lab work & pertinent test results, reviewed documented beta blocker date and time   History of Anesthesia Complications Negative for: history of anesthetic complications  Airway Mallampati: III  TM Distance: >3 FB Neck ROM: Full    Dental  (+) Dental Advisory Given   Pulmonary sleep apnea and Continuous Positive Airway Pressure Ventilation    breath sounds clear to auscultation       Cardiovascular hypertension, Pt. on medications and Pt. on home beta blockers (-) angina  Rhythm:Regular Rate:Normal  '18 ECHO:  -moderate LVH. Systolic function was normal. EF 60-65%. Wall motion was normal;  no regional wall motion abnormalities.   - Aortic valve: Trileaflet; mildly thickened, mildly calcified leaflets.  - Aorta: Aortic root dimension: 43 mm (ED).  - Ascending aorta: The ascending aorta was mildly dilated.  - Left atrium: The atrium was mildly dilated.  - no significant valvular abnormalities    Neuro/Psych negative neurological ROS     GI/Hepatic negative GI ROS, Neg liver ROS,,,  Endo/Other  diabetes (glu 128), Oral Hypoglycemic Agents  BMI 35  Renal/GU Renal InsufficiencyRenal disease     Musculoskeletal   Abdominal   Peds  Hematology   Anesthesia Other Findings   Reproductive/Obstetrics                             Anesthesia Physical Anesthesia Plan  ASA: 3  Anesthesia Plan: General   Post-op Pain Management: Tylenol PO (pre-op)*   Induction: Intravenous  PONV Risk Score and Plan: 2 and Ondansetron and Dexamethasone  Airway Management Planned: Oral ETT  Additional Equipment: None  Intra-op Plan:   Post-operative Plan: Extubation in OR  Informed Consent: I have reviewed the patients History and Physical, chart, labs and  discussed the procedure including the risks, benefits and alternatives for the proposed anesthesia with the patient or authorized representative who has indicated his/her understanding and acceptance.     Dental advisory given  Plan Discussed with: CRNA and Surgeon  Anesthesia Plan Comments: (PAT note by Antionette Poles, PA-C: 57 yo male follows cardiology for history of difficult to control HTN, OSA on CPAP, HLD.  Seen by Jari Favre, PA-C on 05/06/2023 for preop evaluation.  Per note, "Mr. Boese's perioperative risk of a major cardiac event is 0.9% according to the Revised Cardiac Risk Index (RCRI).  Therefore, he is at low risk for perioperative complications.   His functional capacity is fair at 5.07 METs according to the Duke Activity Status Index (DASI). Recommendations: According to ACC/AHA guidelines, no further cardiovascular testing needed.  The patient may proceed to surgery at acceptable risk."  Non-insulin-dependent DM2, A1c 7.0 preop labs.  Preop labs reviewed, creatinine mildly elevated 1.26, hypercalcemia with calcium 11.3, otherwise unremarkable.  EKG 12/21/22: Sinus bradycardia. Rate 58.  TTE 01/29/2017: - Left ventricle: The cavity size was normal. Wall thickness was  increased in a pattern of moderate LVH. Systolic function was  normal. The estimated ejection fraction was in the range of 60%  to 65%. Wall motion was normal; there were no regional wall  motion abnormalities. There was no evidence of elevated  ventricular filling pressure by Doppler parameters.  - Aortic valve: Trileaflet; mildly thickened, mildly calcified  leaflets.  - Aorta: Aortic root dimension: 43 mm (ED).  - Ascending aorta: The ascending aorta  was mildly dilated.  - Left atrium: The atrium was mildly dilated.  - Right ventricle: The cavity size was mildly dilated. Wall  thickness was normal.    )        Anesthesia Quick Evaluation

## 2023-05-10 NOTE — Progress Notes (Signed)
Anesthesia Chart Review:  57 yo male follows cardiology for history of difficult to control HTN, OSA on CPAP, HLD.  Seen by Jari Favre, PA-C on 05/06/2023 for preop evaluation.  Per note, "Mr. Reining's perioperative risk of a major cardiac event is 0.9% according to the Revised Cardiac Risk Index (RCRI).  Therefore, he is at low risk for perioperative complications.   His functional capacity is fair at 5.07 METs according to the Duke Activity Status Index (DASI). Recommendations: According to ACC/AHA guidelines, no further cardiovascular testing needed.  The patient may proceed to surgery at acceptable risk."  Non-insulin-dependent DM2, A1c 7.0 preop labs.  Preop labs reviewed, creatinine mildly elevated 1.26, hypercalcemia with calcium 11.3, otherwise unremarkable.  EKG 12/21/22: Sinus bradycardia. Rate 58.  TTE 01/29/2017: - Left ventricle: The cavity size was normal. Wall thickness was    increased in a pattern of moderate LVH. Systolic function was    normal. The estimated ejection fraction was in the range of 60%    to 65%. Wall motion was normal; there were no regional wall    motion abnormalities. There was no evidence of elevated    ventricular filling pressure by Doppler parameters.  - Aortic valve: Trileaflet; mildly thickened, mildly calcified    leaflets.  - Aorta: Aortic root dimension: 43 mm (ED).  - Ascending aorta: The ascending aorta was mildly dilated.  - Left atrium: The atrium was mildly dilated.  - Right ventricle: The cavity size was mildly dilated. Wall    thickness was normal.     Zannie Cove [facility] Short Stay Center/Anesthesiology Phone [phone] 05/10/2023 1:00 PM

## 2023-05-15 ENCOUNTER — Ambulatory Visit (HOSPITAL_BASED_OUTPATIENT_CLINIC_OR_DEPARTMENT_OTHER): Payer: 59 | Admitting: Anesthesiology

## 2023-05-15 ENCOUNTER — Ambulatory Visit (HOSPITAL_COMMUNITY)
Admission: RE | Admit: 2023-05-15 | Discharge: 2023-05-15 | Disposition: A | Discharge status: Home / Self Care | Payer: 59 | Attending: Orthopedic Surgery | Admitting: Orthopedic Surgery

## 2023-05-15 ENCOUNTER — Encounter (HOSPITAL_COMMUNITY): Payer: Self-pay | Admitting: Orthopedic Surgery

## 2023-05-15 ENCOUNTER — Other Ambulatory Visit: Payer: Self-pay

## 2023-05-15 ENCOUNTER — Ambulatory Visit (HOSPITAL_COMMUNITY): Payer: 59

## 2023-05-15 ENCOUNTER — Encounter (HOSPITAL_COMMUNITY)
Admission: RE | Disposition: A | Discharge status: Home / Self Care | Payer: Self-pay | Source: Home / Self Care | Attending: Orthopedic Surgery

## 2023-05-15 ENCOUNTER — Ambulatory Visit (HOSPITAL_COMMUNITY): Payer: 59 | Admitting: Physician Assistant

## 2023-05-15 DIAGNOSIS — Z7984 Long term (current) use of oral hypoglycemic drugs: Secondary | ICD-10-CM

## 2023-05-15 DIAGNOSIS — E119 Type 2 diabetes mellitus without complications: Secondary | ICD-10-CM

## 2023-05-15 DIAGNOSIS — E785 Hyperlipidemia, unspecified: Secondary | ICD-10-CM | POA: Insufficient documentation

## 2023-05-15 DIAGNOSIS — G4733 Obstructive sleep apnea (adult) (pediatric): Secondary | ICD-10-CM | POA: Insufficient documentation

## 2023-05-15 DIAGNOSIS — I10 Essential (primary) hypertension: Secondary | ICD-10-CM | POA: Diagnosis not present

## 2023-05-15 DIAGNOSIS — M5136 Other intervertebral disc degeneration, lumbar region: Secondary | ICD-10-CM

## 2023-05-15 DIAGNOSIS — Z79899 Other long term (current) drug therapy: Secondary | ICD-10-CM | POA: Insufficient documentation

## 2023-05-15 DIAGNOSIS — M5116 Intervertebral disc disorders with radiculopathy, lumbar region: Secondary | ICD-10-CM | POA: Diagnosis present

## 2023-05-15 HISTORY — PX: LUMBAR LAMINECTOMY/DECOMPRESSION MICRODISCECTOMY: SHX5026

## 2023-05-15 LAB — GLUCOSE, CAPILLARY
Glucose-Capillary: 152 mg/dL — ABNORMAL HIGH (ref 70–99)
Glucose-Capillary: 128 mg/dL — ABNORMAL HIGH (ref 70–99)
Glucose-Capillary: 151 mg/dL — ABNORMAL HIGH (ref 70–99)

## 2023-05-15 SURGERY — LUMBAR LAMINECTOMY/DECOMPRESSION MICRODISCECTOMY
Anesthesia: General | Laterality: Left

## 2023-05-15 MED ORDER — OXYCODONE HCL 5 MG/5ML PO SOLN: 5.0000 mg | Freq: Once | ORAL | Status: DC | PRN

## 2023-05-15 MED ORDER — MIDAZOLAM HCL 2 MG/2ML IJ SOLN
INTRAMUSCULAR | Status: AC
  Filled 2023-05-15: qty 2

## 2023-05-15 MED ORDER — PHENYLEPHRINE 80 MCG/ML (10ML) SYRINGE FOR IV PUSH (FOR BLOOD PRESSURE SUPPORT)
PREFILLED_SYRINGE | INTRAVENOUS | Status: AC
  Filled 2023-05-15: qty 10

## 2023-05-15 MED ORDER — BUPIVACAINE-EPINEPHRINE (PF) 0.25% -1:200000 IJ SOLN
INTRAMUSCULAR | Status: AC
  Filled 2023-05-15: qty 30

## 2023-05-15 MED ORDER — PHENYLEPHRINE HCL-NACL 20-0.9 MG/250ML-% IV SOLN
INTRAVENOUS | Status: DC | PRN
  Administered 2023-05-15: 30 ug/min via INTRAVENOUS

## 2023-05-15 MED ORDER — HYDROMORPHONE HCL 1 MG/ML IJ SOLN
INTRAMUSCULAR | Status: AC
  Filled 2023-05-15: qty 1

## 2023-05-15 MED ORDER — HYDROMORPHONE HCL 1 MG/ML IJ SOLN
0.2500 mg | INTRAMUSCULAR | Status: DC | PRN
  Administered 2023-05-15 (×3): 0.5 mg via INTRAVENOUS

## 2023-05-15 MED ORDER — PROPOFOL 10 MG/ML IV BOLUS
INTRAVENOUS | Status: DC | PRN
  Administered 2023-05-15: 30 mg via INTRAVENOUS
  Administered 2023-05-15: 120 mg via INTRAVENOUS

## 2023-05-15 MED ORDER — FENTANYL CITRATE (PF) 250 MCG/5ML IJ SOLN
INTRAMUSCULAR | Status: AC
  Filled 2023-05-15: qty 5

## 2023-05-15 MED ORDER — ROCURONIUM BROMIDE 10 MG/ML (PF) SYRINGE
PREFILLED_SYRINGE | INTRAVENOUS | Status: AC
  Filled 2023-05-15: qty 10

## 2023-05-15 MED ORDER — ONDANSETRON HCL 4 MG/2ML IJ SOLN
INTRAMUSCULAR | Status: AC
  Filled 2023-05-15: qty 2

## 2023-05-15 MED ORDER — SURGIFLO WITH THROMBIN (HEMOSTATIC MATRIX KIT) OPTIME
TOPICAL | Status: DC | PRN
Start: 2023-05-15 — End: 2023-05-15
  Administered 2023-05-15: 1 via TOPICAL

## 2023-05-15 MED ORDER — LIDOCAINE 2% (20 MG/ML) 5 ML SYRINGE
INTRAMUSCULAR | Status: AC
  Filled 2023-05-15: qty 5

## 2023-05-15 MED ORDER — PROMETHAZINE HCL 25 MG/ML IJ SOLN: 6.2500 mg | INTRAMUSCULAR | Status: DC | PRN

## 2023-05-15 MED ORDER — POVIDONE-IODINE 7.5 % EX SOLN: Freq: Once | CUTANEOUS | Status: DC

## 2023-05-15 MED ORDER — BUPIVACAINE LIPOSOME 1.3 % IJ SUSP
INTRAMUSCULAR | Status: DC | PRN
  Administered 2023-05-15: 20 mL

## 2023-05-15 MED ORDER — INSULIN ASPART 100 UNIT/ML IJ SOLN
INTRAMUSCULAR | Status: AC
  Administered 2023-05-15: 2 [IU] via SUBCUTANEOUS
  Filled 2023-05-15: qty 1

## 2023-05-15 MED ORDER — THROMBIN 20000 UNITS EX SOLR: CUTANEOUS | Status: DC | PRN

## 2023-05-15 MED ORDER — EPHEDRINE SULFATE-NACL 50-0.9 MG/10ML-% IV SOSY
PREFILLED_SYRINGE | INTRAVENOUS | Status: DC | PRN
  Administered 2023-05-15 (×3): 5 mg via INTRAVENOUS

## 2023-05-15 MED ORDER — LACTATED RINGERS IV SOLN: INTRAVENOUS | Status: DC

## 2023-05-15 MED ORDER — MIDAZOLAM HCL 2 MG/2ML IJ SOLN
INTRAMUSCULAR | Status: DC | PRN
  Administered 2023-05-15: 2 mg via INTRAVENOUS

## 2023-05-15 MED ORDER — METHYLPREDNISOLONE ACETATE 80 MG/ML IJ SUSP
INTRAMUSCULAR | Status: AC
  Filled 2023-05-15: qty 1

## 2023-05-15 MED ORDER — 0.9 % SODIUM CHLORIDE (POUR BTL) OPTIME
TOPICAL | Status: DC | PRN
  Administered 2023-05-15: 1000 mL

## 2023-05-15 MED ORDER — INSULIN ASPART 100 UNIT/ML IJ SOLN: 0.0000 [IU] | INTRAMUSCULAR | Status: DC | PRN

## 2023-05-15 MED ORDER — METHYLPREDNISOLONE ACETATE 40 MG/ML IJ SUSP
INTRAMUSCULAR | Status: DC | PRN
  Administered 2023-05-15 (×2): 40 mg

## 2023-05-15 MED ORDER — DEXMEDETOMIDINE HCL IN NACL 80 MCG/20ML IV SOLN
INTRAVENOUS | Status: DC | PRN
Start: 2023-05-15 — End: 2023-05-15
  Administered 2023-05-15: 20 ug via INTRAVENOUS

## 2023-05-15 MED ORDER — ORAL CARE MOUTH RINSE: 15.0000 mL | Freq: Once | OROMUCOSAL | Status: AC

## 2023-05-15 MED ORDER — ONDANSETRON HCL 4 MG/2ML IJ SOLN
INTRAMUSCULAR | Status: DC | PRN
  Administered 2023-05-15: 4 mg via INTRAVENOUS

## 2023-05-15 MED ORDER — METHOCARBAMOL 750 MG PO TABS: 750.0000 mg | ORAL_TABLET | Freq: Four times a day (QID) | ORAL | 2 refills | Status: DC | PRN

## 2023-05-15 MED ORDER — OXYCODONE HCL 5 MG PO TABS: 5.0000 mg | ORAL_TABLET | Freq: Once | ORAL | Status: DC | PRN

## 2023-05-15 MED ORDER — CEFAZOLIN IN SODIUM CHLORIDE 3-0.9 GM/100ML-% IV SOLN
3.0000 g | INTRAVENOUS | Status: AC
  Administered 2023-05-15: 3 g via INTRAVENOUS
  Filled 2023-05-15: qty 100

## 2023-05-15 MED ORDER — PROPOFOL 10 MG/ML IV BOLUS
INTRAVENOUS | Status: AC
  Filled 2023-05-15: qty 20

## 2023-05-15 MED ORDER — THROMBIN 20000 UNITS EX SOLR
CUTANEOUS | Status: AC
  Filled 2023-05-15: qty 20000

## 2023-05-15 MED ORDER — SUGAMMADEX SODIUM 200 MG/2ML IV SOLN
INTRAVENOUS | Status: DC | PRN
  Administered 2023-05-15: 200 mg via INTRAVENOUS

## 2023-05-15 MED ORDER — DEXAMETHASONE SODIUM PHOSPHATE 10 MG/ML IJ SOLN
INTRAMUSCULAR | Status: DC | PRN
  Administered 2023-05-15: 5 mg via INTRAVENOUS

## 2023-05-15 MED ORDER — MEPERIDINE HCL 25 MG/ML IJ SOLN: 6.2500 mg | INTRAMUSCULAR | Status: DC | PRN

## 2023-05-15 MED ORDER — ACETAMINOPHEN 500 MG PO TABS
1000.0000 mg | ORAL_TABLET | Freq: Once | ORAL | Status: AC
  Administered 2023-05-15: 1000 mg via ORAL
  Filled 2023-05-15: qty 2

## 2023-05-15 MED ORDER — LIDOCAINE 2% (20 MG/ML) 5 ML SYRINGE
INTRAMUSCULAR | Status: DC | PRN
  Administered 2023-05-15: 60 mg via INTRAVENOUS

## 2023-05-15 MED ORDER — FENTANYL CITRATE (PF) 250 MCG/5ML IJ SOLN
INTRAMUSCULAR | Status: DC | PRN
  Administered 2023-05-15: 150 ug via INTRAVENOUS
  Administered 2023-05-15 (×2): 50 ug via INTRAVENOUS

## 2023-05-15 MED ORDER — EPHEDRINE 5 MG/ML INJ
INTRAVENOUS | Status: AC
  Filled 2023-05-15: qty 5

## 2023-05-15 MED ORDER — BUPIVACAINE LIPOSOME 1.3 % IJ SUSP
INTRAMUSCULAR | Status: AC
  Filled 2023-05-15: qty 20

## 2023-05-15 MED ORDER — ROCURONIUM BROMIDE 10 MG/ML (PF) SYRINGE
PREFILLED_SYRINGE | INTRAVENOUS | Status: DC | PRN
  Administered 2023-05-15: 70 mg via INTRAVENOUS
  Administered 2023-05-15: 10 mg via INTRAVENOUS
  Administered 2023-05-15: 20 mg via INTRAVENOUS
  Administered 2023-05-15: 30 mg via INTRAVENOUS

## 2023-05-15 MED ORDER — CEFAZOLIN SODIUM-DEXTROSE 2-3 GM-%(50ML) IV SOLR
INTRAVENOUS | Status: DC | PRN
Start: 2023-05-15 — End: 2023-05-15

## 2023-05-15 MED ORDER — CHLORHEXIDINE GLUCONATE 0.12 % MT SOLN
15.0000 mL | Freq: Once | OROMUCOSAL | Status: AC
  Administered 2023-05-15: 15 mL via OROMUCOSAL
  Filled 2023-05-15: qty 15

## 2023-05-15 MED ORDER — MIDAZOLAM HCL 2 MG/2ML IJ SOLN: 0.5000 mg | Freq: Once | INTRAMUSCULAR | Status: DC | PRN

## 2023-05-15 MED ORDER — DEXAMETHASONE SODIUM PHOSPHATE 10 MG/ML IJ SOLN
INTRAMUSCULAR | Status: AC
  Filled 2023-05-15: qty 1

## 2023-05-15 MED ORDER — HYDROCODONE-ACETAMINOPHEN 5-325 MG PO TABS: 1.0000 | ORAL_TABLET | Freq: Four times a day (QID) | ORAL | 0 refills | Status: DC | PRN

## 2023-05-15 MED ORDER — PHENYLEPHRINE HCL (PRESSORS) 10 MG/ML IV SOLN
INTRAVENOUS | Status: DC | PRN
Start: 2023-05-15 — End: 2023-05-15
  Administered 2023-05-15 (×2): 160 ug via INTRAVENOUS

## 2023-05-15 MED ORDER — ALBUMIN HUMAN 5 % IV SOLN
INTRAVENOUS | Status: DC | PRN
Start: 2023-05-15 — End: 2023-05-15

## 2023-05-15 MED ORDER — BUPIVACAINE-EPINEPHRINE 0.25% -1:200000 IJ SOLN
INTRAMUSCULAR | Status: DC | PRN
  Administered 2023-05-15: 8 mL

## 2023-05-15 SURGICAL SUPPLY — 74 items
AGENT HMST KT MTR STRL THRMB (HEMOSTASIS) ×1
APL SKNCLS STERI-STRIP NONHPOA (GAUZE/BANDAGES/DRESSINGS) ×1
BAG COUNTER SPONGE SURGICOUNT (BAG) ×1 IMPLANT
BAG SPNG CNTER NS LX DISP (BAG) ×1
BENZOIN TINCTURE PRP APPL 2/3 (GAUZE/BANDAGES/DRESSINGS) IMPLANT
BNDG GAUZE DERMACEA FLUFF 4 (GAUZE/BANDAGES/DRESSINGS) ×1 IMPLANT
BNDG GZE DERMACEA 4 6PLY (GAUZE/BANDAGES/DRESSINGS) ×1
BUR ROUND PRECISION 4.0 (BURR) ×1 IMPLANT
CABLE BIPOLOR RESECTION CORD (MISCELLANEOUS) ×1 IMPLANT
CANISTER SUCT 3000ML PPV (MISCELLANEOUS) ×1 IMPLANT
COVER SURGICAL LIGHT HANDLE (MISCELLANEOUS) ×1 IMPLANT
DRAIN CHANNEL 15F RND FF W/TCR (WOUND CARE) IMPLANT
DRAPE POUCH INSTRU U-SHP 10X18 (DRAPES) ×2 IMPLANT
DRAPE SURG 17X23 STRL (DRAPES) ×4 IMPLANT
DURAPREP 26ML APPLICATOR (WOUND CARE) ×1 IMPLANT
ELECT BLADE 4.0 EZ CLEAN MEGAD (MISCELLANEOUS) ×1
ELECT CAUTERY BLADE 6.4 (BLADE) ×1 IMPLANT
ELECT REM PT RETURN 9FT ADLT (ELECTROSURGICAL) ×1
ELECTRODE BLDE 4.0 EZ CLN MEGD (MISCELLANEOUS) ×1 IMPLANT
ELECTRODE REM PT RTRN 9FT ADLT (ELECTROSURGICAL) ×1 IMPLANT
EVACUATOR SILICONE 100CC (DRAIN) IMPLANT
FILTER STRAW FLUID ASPIR (MISCELLANEOUS) ×1 IMPLANT
GAUZE 4X4 16PLY ~~LOC~~+RFID DBL (SPONGE) ×2 IMPLANT
GAUZE SPONGE 4X4 12PLY STRL (GAUZE/BANDAGES/DRESSINGS) ×1 IMPLANT
GLOVE BIO SURGEON STRL SZ 6.5 (GLOVE) ×1 IMPLANT
GLOVE BIO SURGEON STRL SZ8 (GLOVE) ×1 IMPLANT
GLOVE BIOGEL PI IND STRL 6.5 (GLOVE) IMPLANT
GLOVE BIOGEL PI IND STRL 7.0 (GLOVE) ×1 IMPLANT
GLOVE BIOGEL PI IND STRL 8 (GLOVE) ×1 IMPLANT
GLOVE SURG ENC MOIS LTX SZ6.5 (GLOVE) ×1 IMPLANT
GOWN STRL REUS W/ TWL LRG LVL3 (GOWN DISPOSABLE) ×1 IMPLANT
GOWN STRL REUS W/ TWL XL LVL3 (GOWN DISPOSABLE) ×2 IMPLANT
GOWN STRL REUS W/TWL LRG LVL3 (GOWN DISPOSABLE) ×1
GOWN STRL REUS W/TWL XL LVL3 (GOWN DISPOSABLE) ×2
GOWN STRL SURGICAL XL XLNG (GOWN DISPOSABLE) IMPLANT
IV CATH 14GX2 1/4 (CATHETERS) ×1 IMPLANT
KIT BASIN OR (CUSTOM PROCEDURE TRAY) ×1 IMPLANT
KIT POSITION SURG JACKSON T1 (MISCELLANEOUS) ×1 IMPLANT
KIT TURNOVER KIT B (KITS) ×1 IMPLANT
MARKER SKIN DUAL TIP RULER LAB (MISCELLANEOUS) ×1 IMPLANT
NDL 18GX1X1/2 (RX/OR ONLY) (NEEDLE) ×1 IMPLANT
NDL 22X1.5 STRL (OR ONLY) (MISCELLANEOUS) ×1 IMPLANT
NDL HYPO 25GX1X1/2 BEV (NEEDLE) ×1 IMPLANT
NDL SPNL 18GX3.5 QUINCKE PK (NEEDLE) ×2 IMPLANT
NEEDLE 18GX1X1/2 (RX/OR ONLY) (NEEDLE) ×1
NEEDLE 22X1.5 STRL (OR ONLY) (MISCELLANEOUS) ×1
NEEDLE HYPO 25GX1X1/2 BEV (NEEDLE) ×1
NEEDLE SPNL 18GX3.5 QUINCKE PK (NEEDLE) ×2
NS IRRIG 1000ML POUR BTL (IV SOLUTION) ×1 IMPLANT
PACK LAMINECTOMY ORTHO (CUSTOM PROCEDURE TRAY) ×1 IMPLANT
PACK UNIVERSAL I (CUSTOM PROCEDURE TRAY) ×1 IMPLANT
PAD ARMBOARD 7.5X6 YLW CONV (MISCELLANEOUS) ×2 IMPLANT
PATTIES SURGICAL .5 X.5 (GAUZE/BANDAGES/DRESSINGS) IMPLANT
PATTIES SURGICAL .5 X1 (DISPOSABLE) ×1 IMPLANT
SPONGE INTESTINAL PEANUT (DISPOSABLE) ×1 IMPLANT
SPONGE SURGIFOAM ABS GEL 100 (HEMOSTASIS) IMPLANT
SPONGE SURGIFOAM ABS GEL SZ50 (HEMOSTASIS) ×1 IMPLANT
STRIP CLOSURE SKIN 1/2X4 (GAUZE/BANDAGES/DRESSINGS) IMPLANT
SURGIFLO W/THROMBIN 8M KIT (HEMOSTASIS) IMPLANT
SUT MNCRL AB 4-0 PS2 18 (SUTURE) ×1 IMPLANT
SUT VIC AB 0 CT1 18XCR BRD 8 (SUTURE) IMPLANT
SUT VIC AB 0 CT1 8-18 (SUTURE) ×1
SUT VIC AB 1 CT1 18XCR BRD 8 (SUTURE) ×1 IMPLANT
SUT VIC AB 1 CT1 8-18 (SUTURE) ×1
SUT VIC AB 2-0 CT2 18 VCP726D (SUTURE) ×1 IMPLANT
SYR 20ML LL LF (SYRINGE) ×1 IMPLANT
SYR BULB IRRIG 60ML STRL (SYRINGE) ×1 IMPLANT
SYR CONTROL 10ML LL (SYRINGE) ×2 IMPLANT
SYR TB 1ML LUER SLIP (SYRINGE) ×4 IMPLANT
TAPE CLOTH SURG 4X10 WHT LF (GAUZE/BANDAGES/DRESSINGS) IMPLANT
TOWEL GREEN STERILE (TOWEL DISPOSABLE) ×1 IMPLANT
TOWEL GREEN STERILE FF (TOWEL DISPOSABLE) ×1 IMPLANT
WATER STERILE IRR 1000ML POUR (IV SOLUTION) ×1 IMPLANT
YANKAUER SUCT BULB TIP NO VENT (SUCTIONS) ×1 IMPLANT

## 2023-05-15 NOTE — Transfer of Care (Signed)
Immediate Anesthesia Transfer of Care Note  Patient: Gregory Khan  Procedure(s) Performed: LEFT-SIDED LUMBAR FOUR - LUMBAR FIVE MICRODISECTOMY (Left)  Patient Location: PACU  Anesthesia Type:General  Level of Consciousness: awake, alert , patient cooperative, and responds to stimulation  Airway & Oxygen Therapy: Patient Spontanous Breathing and Patient connected to face mask oxygen  Post-op Assessment: Report given to RN, Post -op Vital signs reviewed and stable, and Patient moving all extremities X 4  Post vital signs: Reviewed and stable  Last Vitals:  Vitals Value Taken Time  BP 123/60 05/15/23 1315  Temp 36.1 C 05/15/23 1310  Pulse 79 05/15/23 1320  Resp 21 05/15/23 1320  SpO2 95 % 05/15/23 1320  Vitals shown include unfiled device data.  Last Pain:  Vitals:   05/15/23 1310  TempSrc:   PainSc: Asleep      Patients Stated Pain Goal: 0 (05/15/23 0726)  Complications: No notable events documented.

## 2023-05-15 NOTE — Op Note (Signed)
PATIENT NAME: Gregory Khan   MEDICAL RECORD NO.:   [id number]   DATE OF BIRTH: 23-Nov-1965   DATE OF PROCEDURE: 05/15/2023                                      OPERATIVE REPORT     PREOPERATIVE DIAGNOSES: 1. Left-sided L5 radiculopathy. 2. Left-sided L4/5 disk herniation causing      compression of the left L5 nerve.   POSTOPERATIVE DIAGNOSES: 1. Left-sided L5 radiculopathy. 2. Left-sided L4/5 disk herniation causing      compression of the left L5 nerve.   PROCEDURES:  Left-sided L4/5 laminotomy with partial facetectomy and removal of herniated left-sided L4/5 disk fragment.   SURGEON:  Estill Bamberg, MD.   ASSISTANT:  None   ANESTHESIA:  General endotracheal anesthesia.   COMPLICATIONS:  None.   DISPOSITION:  Stable.   ESTIMATED BLOOD LOSS:  Minimal.   INDICATIONS FOR SURGERY:  Briefly, Gregory Khan is a pleasant 57 year old male, who did present to me with pain, weakness, and numbness in the left leg.  The patient's MRI did reveal the findings outlined above.  The patient did have a trial of conservative care, but did continue to have weakness, numbness, and pain in the left leg.  As such, we did ultimately elect to proceed with the procedure reflected above.  The patient was fully made aware of the risks of surgery, including the risk of recurrent herniation and the need for subsequent surgery, including the possibility of a subsequent diskectomy and/or fusion.   OPERATIVE DETAILS:  On 05/15/2023, the patient was brought to surgery and general endotracheal anesthesia was administered.  The patient was placed prone on a well-padded flat Jackson bed with a spinal frame.  Antibiotics were given.  The back was prepped and draped and a time-out procedure was performed.  At this point, a midline incision was made directly over the L4/5 intervertebral space.  A curvilinear incision was made just to the left of the midline into the fascia.  A self-retaining McCulloch  retractor was placed.  The lamina of L4 and L5 was identified and subperiosteally exposed.  I then removed the lateral aspect of the L4/5 ligamentum flavum.  Readily identified was the traversing left L5 nerve, which was noted to be under obvious tension, and was noted to be rather erythematous.  I was able to gently gain medial retraction of the nerve, and in doing so, herniated disc fragments were noted, immediately ventral to the nerve.  These were migrated inferiorly, behind the L5 vertebral body. The herniated fragments were uneventfully removed. An annulotomy was then performed and additional fragments were removed immediately ventral to the annulus. At this point, the traversing left L5 nerve was evaluated, and was noted to be free and mobile, and entirely free of any compression. I was very pleased with the final decompression that I was able to accomplish.  At this point, the wound was copiously irrigated with normal saline.  All epidural bleeding was controlled using bipolar electrocautery in addition to Surgiflo. All bleeding was controlled at the termination of the procedure.  At this point, 40 mg of Depo-Medrol was introduced about the epidural space in the region of the left L5 nerve.  The wound was then closed in layers using #1 Vicryl followed by 2-0 Vicryl, followed by 4-0 Monocryl. Benzoin and Steri-Strips were applied followed by a sterile dressing. All instrument  counts were correct at the termination of the procedure.     Estill Bamberg, MD

## 2023-05-15 NOTE — Anesthesia Postprocedure Evaluation (Signed)
Anesthesia Post Note  Patient: Gregory Khan  Procedure(s) Performed: LEFT-SIDED LUMBAR FOUR - LUMBAR FIVE MICRODISECTOMY (Left)     Patient location during evaluation: PACU Anesthesia Type: General Level of consciousness: awake and alert, patient cooperative and oriented Pain management: pain level controlled Vital Signs Assessment: post-procedure vital signs reviewed and stable Respiratory status: spontaneous breathing, nonlabored ventilation and respiratory function stable Cardiovascular status: blood pressure returned to baseline and stable Postop Assessment: no apparent nausea or vomiting Anesthetic complications: no   No notable events documented.  Last Vitals:  Vitals:   05/15/23 1430 05/15/23 1445  BP: (!) 141/98 134/89  Pulse: 76 68  Resp: 17 12  Temp:  (!) 36.1 C  SpO2: 98% 97%    Last Pain:  Vitals:   05/15/23 1445  TempSrc:   PainSc: 3                  Jayleene Glaeser,E. Callan Yontz

## 2023-05-15 NOTE — Anesthesia Procedure Notes (Addendum)
Procedure Name: Intubation Date/Time: 05/15/2023 11:06 AM  Performed by: Alease Medina, CRNAPre-anesthesia Checklist: Patient identified, Emergency Drugs available, Suction available and Patient being monitored Patient Re-evaluated:Patient Re-evaluated prior to induction Oxygen Delivery Method: Circle system utilized Preoxygenation: Pre-oxygenation with 100% oxygen Induction Type: IV induction Ventilation: Two handed mask ventilation required, Mask ventilation with difficulty and Oral airway inserted - appropriate to patient size Laryngoscope Size: Glidescope, 4 and Mac Grade View: Grade I Tube type: Oral Tube size: 7.5 mm Number of attempts: 1 Airway Equipment and Method: Oral airway, Video-laryngoscopy and Rigid stylet Placement Confirmation: ETT inserted through vocal cords under direct vision, positive ETCO2 and breath sounds checked- equal and bilateral Secured at: 23 cm Tube secured with: Tape Dental Injury: Teeth and Oropharynx as per pre-operative assessment  Difficulty Due To: Difficult Airway- due to limited oral opening and Difficult Airway- due to large tongue

## 2023-05-15 NOTE — H&P (Signed)
PREOPERATIVE H&P  Chief Complaint: Left leg pain  HPI: Gregory Khan is a 57 y.o. male who presents with ongoing pain in the left leg  MRI reveals a left L4/5 disc herniation  Patient has failed multiple forms of conservative care and continues to have pain (see office notes for additional details regarding the patient's full course of treatment)  Past Medical History:  Diagnosis Date   Arthritis    Hx. Bilateral Knee Replacement   Diabetes mellitus without complication (HCC)    Type 2   Hyperlipidemia    Hypertension    Obesity    Past Surgical History:  Procedure Laterality Date   COLONOSCOPY  2017   KNEE ARTHROPLASTY Bilateral 2018   Seperate surgeries   WISDOM TOOTH EXTRACTION     Social History   Socioeconomic History   Marital status: Married    Spouse name: Not on file   Number of children: Not on file   Years of education: Not on file   Highest education level: Not on file  Occupational History   Not on file  Tobacco Use   Smoking status: Never   Smokeless tobacco: Never  Substance and Sexual Activity   Alcohol use: Yes    Comment: Weekends socially   Drug use: No   Sexual activity: Yes  Other Topics Concern   Not on file  Social History Narrative   Not on file   Social Determinants of Health   Financial Resource Strain: Not on file  Food Insecurity: Not on file  Transportation Needs: Not on file  Physical Activity: Not on file  Stress: Not on file  Social Connections: Not on file   Family History  Problem Relation Age of Onset   Hypertension Father    Colon cancer Neg Hx    Colon polyps Neg Hx    No Known Allergies Prior to Admission medications   Medication Sig Start Date End Date Taking? Authorizing Provider  acetaminophen (TYLENOL) 500 MG tablet Take 1,000 mg by mouth every 6 (six) hours as needed for moderate pain.   Yes [provider]  amLODipine (NORVASC) 10 MG tablet Take 1 tablet (10 mg total) by mouth daily.  12/25/22  Yes Lennette Bihari, MD  atorvastatin (LIPITOR) 40 MG tablet Take 1 tablet (40 mg total) by mouth daily. 11/05/22  Yes Lennette Bihari, MD  glyBURIDE (DIABETA) 2.5 MG tablet Take 1 tablet (2.5 mg total) by mouth 2 (two) times daily with a meal. Patient taking differently: Take 2.5 mg by mouth See admin instructions. Take 2.5 mg twice daily, may take a third 2.5 mg tablet as needed for high blood sugar 09/13/17  Yes Lennette Bihari, MD  lisinopril (ZESTRIL) 40 MG tablet TAKE 1 TABLET BY MOUTH EVERY DAY 11/26/22  Yes Lennette Bihari, MD  metFORMIN (GLUCOPHAGE-XR) 500 MG 24 hr tablet Take 1,000 mg by mouth 2 (two) times daily.   Yes [provider]  metoprolol succinate (TOPROL-XL) 100 MG 24 hr tablet TAKE 1 TABLET BY MOUTH DAILY. TAKE WITH OR IMMEDIATELY FOLLOWING A MEAL. 02/26/23  Yes Lennette Bihari, MD  spironolactone (ALDACTONE) 25 MG tablet TAKE 1 TABLET (25 MG TOTAL) BY MOUTH DAILY. 11/26/22  Yes Lennette Bihari, MD  tiZANidine (ZANAFLEX) 4 MG tablet Take 4 mg by mouth as needed for muscle spasms. 12/16/22  Yes [provider]     All other systems have been reviewed and were otherwise negative with the exception  of those mentioned in the HPI and as above.  Physical Exam: Vitals:   05/15/23 0709  BP: (!) 153/98  Pulse: 76  Resp: 18  Temp: 98.1 F (36.7 C)  SpO2: 95%    Body mass index is 34.96 kg/m.  General: Alert, no acute distress Cardiovascular: No pedal edema Respiratory: No cyanosis, no use of accessory musculature Skin: No lesions in the area of chief complaint Neurologic: Sensation intact distally Psychiatric: Patient is competent for consent with normal mood and affect Lymphatic: No axillary or cervical lymphadenopathy   Assessment/Plan: DISC HERNIATION at L4/5 Plan for Procedure(s): LEFT-SIDED LUMBAR 4 - LUMBAR 5 MICRODISECTOMY   Jackelyn Hoehn, MD 05/15/2023 7:26 AM

## 2023-05-16 ENCOUNTER — Encounter (HOSPITAL_COMMUNITY): Payer: Self-pay | Admitting: Orthopedic Surgery

## 2023-05-20 ENCOUNTER — Other Ambulatory Visit: Payer: Self-pay | Admitting: Cardiovascular Disease

## 2023-05-20 DIAGNOSIS — Z79899 Other long term (current) drug therapy: Secondary | ICD-10-CM

## 2023-05-20 DIAGNOSIS — E66811 Obesity, class 1: Secondary | ICD-10-CM

## 2023-05-20 DIAGNOSIS — I10 Essential (primary) hypertension: Secondary | ICD-10-CM

## 2023-05-20 DIAGNOSIS — E119 Type 2 diabetes mellitus without complications: Secondary | ICD-10-CM

## 2023-05-20 DIAGNOSIS — Z01818 Encounter for other preprocedural examination: Secondary | ICD-10-CM

## 2023-05-20 DIAGNOSIS — N183 Chronic kidney disease, stage 3 unspecified: Secondary | ICD-10-CM

## 2023-06-27 ENCOUNTER — Ambulatory Visit: Payer: 59 | Admitting: Cardiovascular Disease

## 2023-07-12 ENCOUNTER — Other Ambulatory Visit: Payer: Self-pay | Admitting: Orthopedic Surgery

## 2023-07-12 DIAGNOSIS — M5459 Other low back pain: Secondary | ICD-10-CM

## 2023-07-12 DIAGNOSIS — Z9889 Other specified postprocedural states: Secondary | ICD-10-CM

## 2023-07-17 ENCOUNTER — Ambulatory Visit
Admission: RE | Admit: 2023-07-17 | Discharge: 2023-07-17 | Disposition: A | Discharge status: Home / Self Care | Payer: 59 | Source: Ambulatory Visit | Attending: Orthopedic Surgery | Admitting: Orthopedic Surgery

## 2023-07-17 DIAGNOSIS — M5459 Other low back pain: Secondary | ICD-10-CM

## 2023-07-17 DIAGNOSIS — Z9889 Other specified postprocedural states: Secondary | ICD-10-CM

## 2023-07-17 MED ORDER — GADOPICLENOL 0.5 MMOL/ML IV SOLN
10.0000 mL | Freq: Once | INTRAVENOUS | Status: AC | PRN
  Administered 2023-07-17: 10 mL via INTRAVENOUS

## 2023-08-22 ENCOUNTER — Other Ambulatory Visit: Payer: Self-pay | Admitting: Cardiovascular Disease

## 2023-08-22 DIAGNOSIS — Z79899 Other long term (current) drug therapy: Secondary | ICD-10-CM

## 2023-08-22 DIAGNOSIS — I10 Essential (primary) hypertension: Secondary | ICD-10-CM

## 2023-08-22 DIAGNOSIS — E66811 Obesity, class 1: Secondary | ICD-10-CM

## 2023-08-22 DIAGNOSIS — N183 Chronic kidney disease, stage 3 unspecified: Secondary | ICD-10-CM

## 2023-08-22 DIAGNOSIS — Z01818 Encounter for other preprocedural examination: Secondary | ICD-10-CM

## 2023-08-22 DIAGNOSIS — E119 Type 2 diabetes mellitus without complications: Secondary | ICD-10-CM

## 2023-08-28 ENCOUNTER — Other Ambulatory Visit: Payer: 59

## 2023-08-29 ENCOUNTER — Other Ambulatory Visit: Payer: Self-pay | Admitting: Orthopedic Surgery

## 2023-09-03 IMAGING — CT CT ANGIO CHEST
2 of 6 series · 13 of 36 positions shown · IV contrast (iopamidol)
Comparison: 05/26/2020 and previous

CLINICAL DATA: Thoracic aortic aneurysm

EXAM:
CT ANGIOGRAPHY CHEST WITH CONTRAST
TECHNIQUE: Multidetector CT imaging of the chest was performed using the
standard protocol during bolus administration of intravenous
contrast. Multiplanar CT image reconstructions and MIPs were
obtained to evaluate the vascular anatomy.
CONTRAST:  100mL [id number] IOPAMIDOL ([id number]) INJECTION 76%

[Series 5: cta thorax 2.00 bv36 s3 axial arterial · axial · arterial · 0.80mm/px · z∈[+1575,+1851]mm · 12 of 164 slices shown]
[im 13/164  lung]
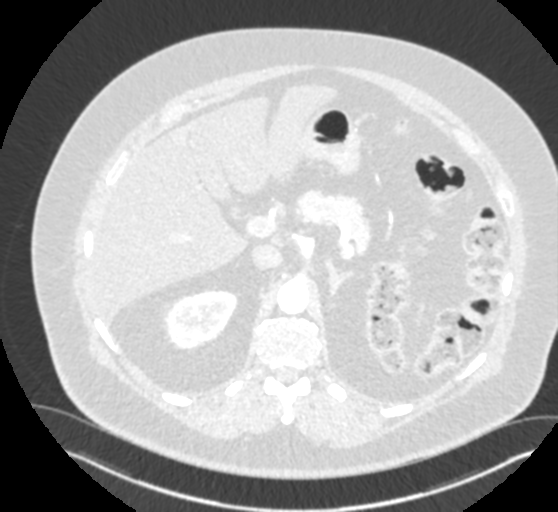
[im 26/164  mediastinal]
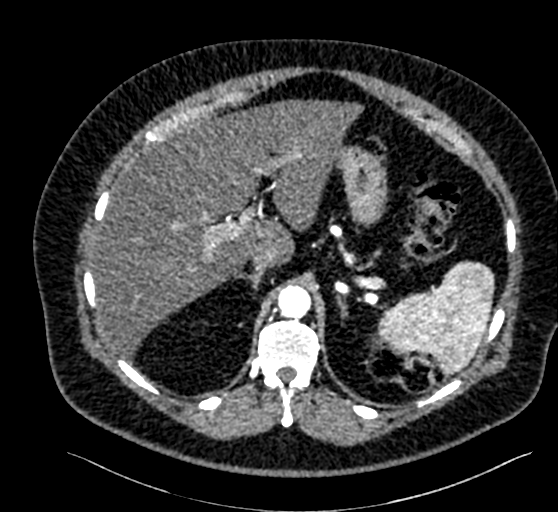
[im 38/164  lung]
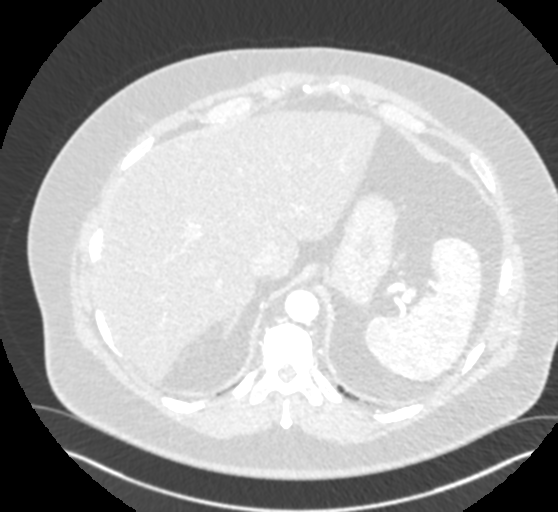
[im 51/164  mediastinal]
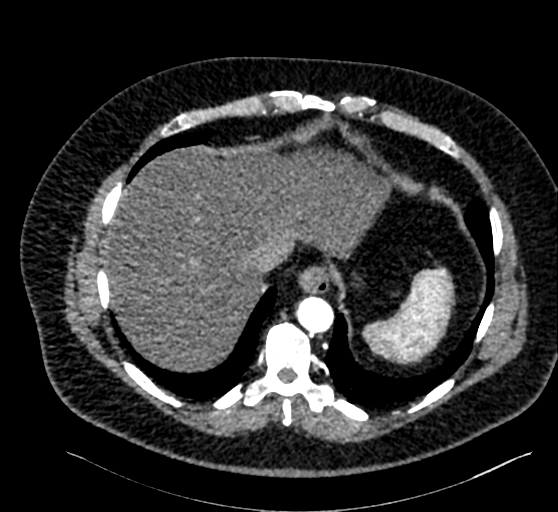
[im 63/164  lung]
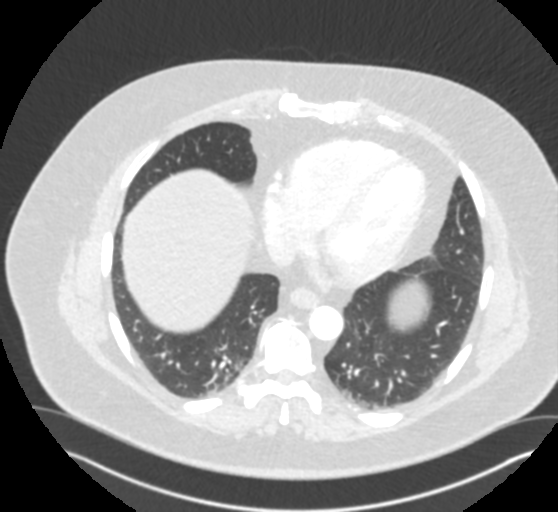
[im 76/164  mediastinal]
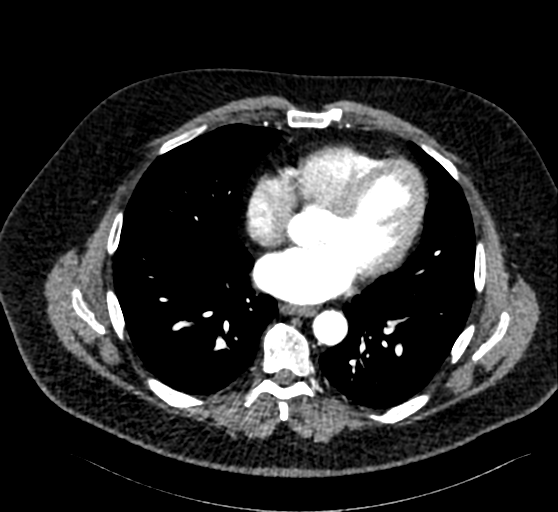
[im 88/164  lung]
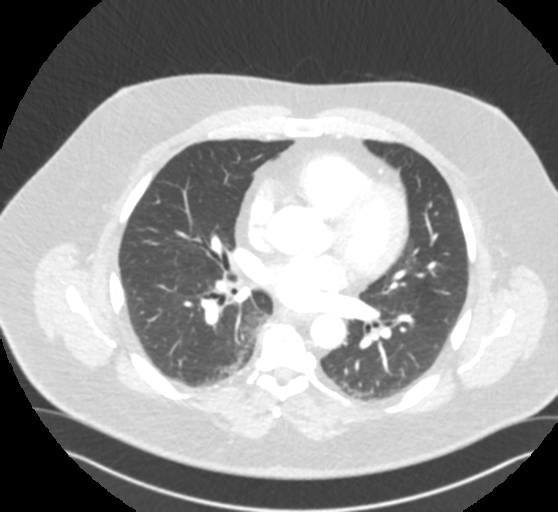
[im 101/164  mediastinal]
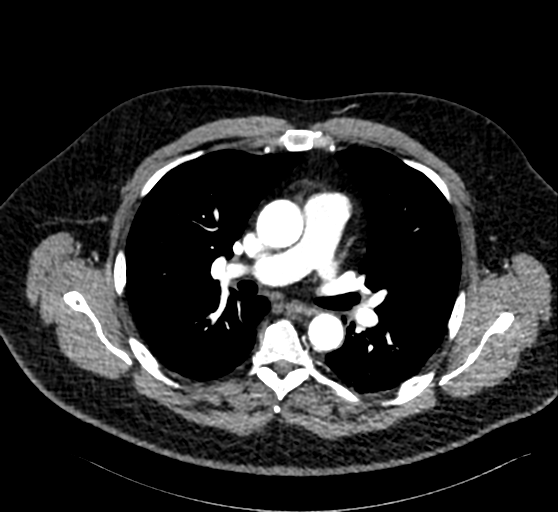
[im 113/164  lung]
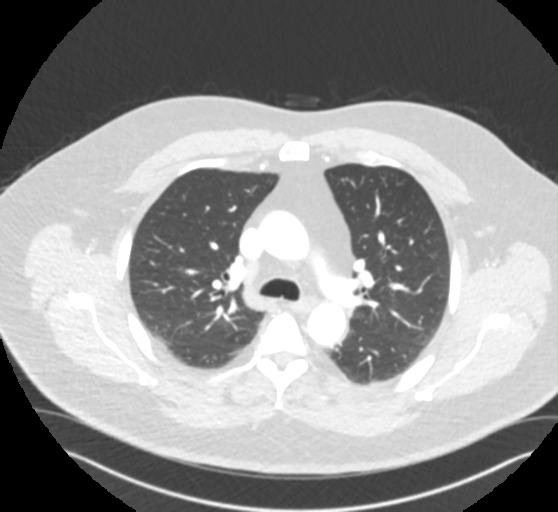
[im 126/164  mediastinal]
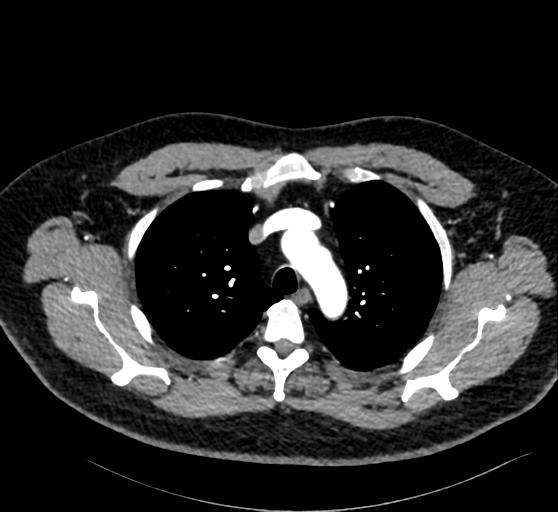
[im 138/164  lung]
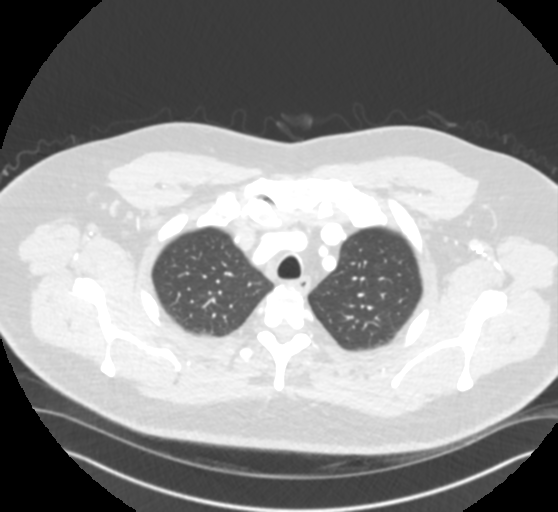
[im 151/164  mediastinal]
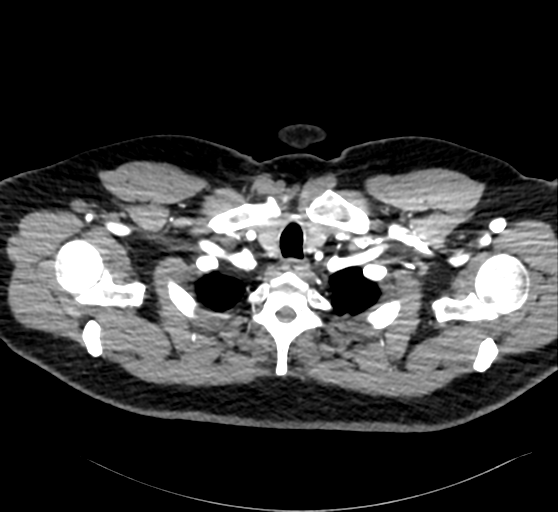

[Series 10: cta thorax 2.00 bv36 s3 cor st · coronal · 0.64mm/px · 1 of 204 slices shown]
[im 102/204  mediastinal]
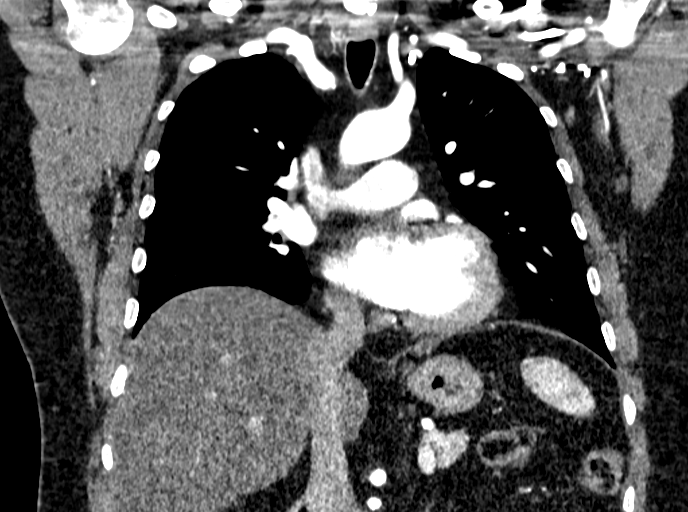

[13 of 36 positions shown; findings below may reference images not displayed]

FINDINGS: Cardiovascular: Heart size normal. No pericardial effusion.
Satisfactory opacification of pulmonary arteries noted, and there is
no evidence of pulmonary emboli. Good contrast opacification of the
thoracic aorta. No evidence of dissection or stenosis. Scattered
coronary calcifications.

Aortic Root:

--Valve: 2.9 cm

--Sinuses: 4.3 cm

--Sinotubular Junction: 3.6 cm

Limitations by motion: Moderate

Thoracic Aorta:

--Ascending Aorta: 3.7 cm

--Aortic Arch: 3.4  cm (previously 3.3 by my measurement)

--Descending Aorta: 2.8 cm

Mediastinum/Nodes: Small hiatal hernia.  No mass or adenopathy.

Lungs/Pleura: No pleural effusion. Patchy dependent atelectasis. 7
mm subpleural nodule, medial right lower lobe (Im85,Se7) , partially
obscured on the prior study by a adjacent atelectasis. Lungs
otherwise clear.

Upper Abdomen: Fatty liver.  No acute findings.

Musculoskeletal: Anterior vertebral endplate spurring at multiple
levels in the mid and lower thoracic spine.

Review of the MIP images confirms the above findings.
IMPRESSION: 1. 3.4 cm thoracic aortic arch aneurysm (previously 3.3) without
complicating features. Recommend annual imaging followup by CTA or
MRA. This recommendation follows 8121
ACCF/AHA/AATS/ACR/ASA/SCA/YE GETA/KEILA/JOYNER/JAGUN Guidelines for the
Diagnosis and Management of Patients with Thoracic Aortic Disease.
Circulation.8121; 121: E266-e369. Aortic aneurysm NOS ([id number].Q)
2. 7 mm right solid pulmonary nodule. If patient is high risk for
malignancy, recommend an additional non-contrast Chest CT at 18-24
months; if patient is low risk for malignancy a non-contrast Chest
CT at 18-24 months is optional.
3. Fatty liver.

## 2023-09-13 ENCOUNTER — Telehealth: Payer: Self-pay | Admitting: Cardiovascular Disease

## 2023-09-13 NOTE — Telephone Encounter (Signed)
Patient is requesting call back to discuss changing upcoming pre-op appt to tele appt, since he says he is not very mobile. Requesting call back.

## 2023-09-13 NOTE — Pre-Procedure Instructions (Signed)
Surgical Instructions   Your procedure is scheduled on September 19, 2023. Report to [facility] Main Entrance "A" at 9:00 A.M., then check in with the Admitting office. Any questions or running late day of surgery: call [phone]  Questions prior to your surgery date: call [phone], Monday-Friday, 8am-4pm. If you experience any cold or flu symptoms such as cough, fever, chills, shortness of breath, etc. between now and your scheduled surgery, please notify us at the above number.     Remember:  Do not eat after midnight the night before your surgery  You may drink clear liquids until 9:00 AM the morning of your surgery.   Clear liquids allowed are: Water, Non-Citrus Juices (without pulp), Carbonated Beverages, Clear Tea (no milk, honey, etc.), Black Coffee Only (NO MILK, CREAM OR POWDERED CREAMER of any kind), and Gatorade.  Patient Instructions  The night before surgery:  No food after midnight. ONLY clear liquids after midnight  The day of surgery (if you have diabetes): Drink ONE (1) 12 oz G2 given to you in your pre admission testing appointment by 9:00 AM the morning of surgery. Drink in one sitting. Do not sip.  This drink was given to you during your hospital  pre-op appointment visit.  Nothing else to drink after completing the  12 oz bottle of G2.         If you have questions, please contact your surgeon's office.    Take these medicines the morning of surgery with A SIP OF WATER: amLODipine (NORVASC)  atorvastatin (LIPITOR)  gabapentin (NEURONTIN)  metoprolol succinate (TOPROL-XL)    May take these medicines IF NEEDED: acetaminophen (TYLENOL)  oxyCODONE-acetaminophen (PERCOCET/ROXICET)    One week prior to surgery, STOP taking any Aspirin (unless otherwise instructed by your surgeon) Aleve, Naproxen, Ibuprofen, Motrin, Advil, Goody's, BC's, all herbal medications, fish oil, and non-prescription vitamins.   WHAT DO I DO ABOUT MY DIABETES  MEDICATION?   Do not take glyBURIDE (DIABETA) the evening before surgery or the morning of surgery.  Do not take metFORMIN (GLUCOPHAGE-XR) the morning of surgery.   HOW TO MANAGE YOUR DIABETES BEFORE AND AFTER SURGERY  Why is it important to control my blood sugar before and after surgery? Improving blood sugar levels before and after surgery helps healing and can limit problems. A way of improving blood sugar control is eating a healthy diet by:  Eating less sugar and carbohydrates  Increasing activity/exercise  Talking with your doctor about reaching your blood sugar goals High blood sugars (greater than 180 mg/dL) can raise your risk of infections and slow your recovery, so you will need to focus on controlling your diabetes during the weeks before surgery. Make sure that the doctor who takes care of your diabetes knows about your planned surgery including the date and location.  How do I manage my blood sugar before surgery? Check your blood sugar at least 4 times a day, starting 2 days before surgery, to make sure that the level is not too high or low.  Check your blood sugar the morning of your surgery when you wake up and every 2 hours until you get to the Short Stay unit.  If your blood sugar is less than 70 mg/dL, you will need to treat for low blood sugar: Do not take insulin. Treat a low blood sugar (less than 70 mg/dL) with  cup of clear juice (cranberry or apple), 4 glucose tablets, OR glucose gel. Recheck blood sugar in 15 minutes after treatment (to make  sure it is greater than 70 mg/dL). If your blood sugar is not greater than 70 mg/dL on recheck, call [phone] for further instructions. Report your blood sugar to the short stay nurse when you get to Short Stay.  If you are admitted to the hospital after surgery: Your blood sugar will be checked by the staff and you will probably be given insulin after surgery (instead of oral diabetes medicines) to make sure you  have good blood sugar levels. The goal for blood sugar control after surgery is 80-180 mg/dL.                      Do NOT Smoke (Tobacco/Vaping) for 24 hours prior to your procedure.  If you use a CPAP at night, you may bring your mask/headgear for your overnight stay.   You will be asked to remove any contacts, glasses, piercing's, hearing aid's, dentures/partials prior to surgery. Please bring cases for these items if needed.    Patients discharged the day of surgery will not be allowed to drive home, and someone needs to stay with them for 24 hours.  SURGICAL WAITING ROOM VISITATION Patients may have no more than 2 support people in the waiting area - these visitors may rotate.   Pre-op nurse will coordinate an appropriate time for 1 ADULT support person, who may not rotate, to accompany patient in pre-op.  Children under the age of 47 must have an adult with them who is not the patient and must remain in the main waiting area with an adult.  If the patient needs to stay at the hospital during part of their recovery, the visitor guidelines for inpatient rooms apply.  Please refer to the [facility] website for the visitor guidelines for any additional information.   If you received a COVID test during your pre-op visit  it is requested that you wear a mask when out in public, stay away from anyone that may not be feeling well and notify your surgeon if you develop symptoms. If you have been in contact with anyone that has tested positive in the last 10 days please notify you surgeon.      Pre-operative 5 CHG Bathing Instructions   You can play a key role in reducing the risk of infection after surgery. Your skin needs to be as free of germs as possible. You can reduce the number of germs on your skin by washing with CHG (chlorhexidine gluconate) soap before surgery. CHG is an antiseptic soap that kills germs and continues to kill germs even after washing.   DO NOT use if you have  an allergy to chlorhexidine/CHG or antibacterial soaps. If your skin becomes reddened or irritated, stop using the CHG and notify one of our RNs at [phone].   Please shower with the CHG soap starting 4 days before surgery using the following schedule:     Please keep in mind the following:  DO NOT shave, including legs and underarms, starting the day of your first shower.   You may shave your face at any point before/day of surgery.  Place clean sheets on your bed the day you start using CHG soap. Use a clean washcloth (not used since being washed) for each shower. DO NOT sleep with pets once you start using the CHG.   CHG Shower Instructions:  Wash your face and private area with normal soap. If you choose to wash your hair, wash first with your normal shampoo.  After  you use shampoo/soap, rinse your hair and body thoroughly to remove shampoo/soap residue.  Turn the water OFF and apply about 3 tablespoons (45 ml) of CHG soap to a CLEAN washcloth.  Apply CHG soap ONLY FROM YOUR NECK DOWN TO YOUR TOES (washing for 3-5 minutes)  DO NOT use CHG soap on face, private areas, open wounds, or sores.  Pay special attention to the area where your surgery is being performed.  If you are having back surgery, having someone wash your back for you may be helpful. Wait 2 minutes after CHG soap is applied, then you may rinse off the CHG soap.  Pat dry with a clean towel  Put on clean clothes/pajamas   If you choose to wear lotion, please use ONLY the CHG-compatible lotions that are listed below.  Additional instructions for the day of surgery: DO NOT APPLY any lotions, deodorants, cologne, or perfumes.   Do not bring valuables to the hospital. [facility] is not responsible for any belongings/valuables. Do not wear nail polish, gel polish, artificial nails, or any other type of covering on natural nails (fingers and toes) Do not wear jewelry or makeup Put on clean/comfortable clothes.  Please  brush your teeth.  Ask your nurse before applying any prescription medications to the skin.     CHG Compatible Lotions   Aveeno Moisturizing lotion  Cetaphil Moisturizing Cream  Cetaphil Moisturizing Lotion  Clairol Herbal Essence Moisturizing Lotion, Dry Skin  Clairol Herbal Essence Moisturizing Lotion, Extra Dry Skin  Clairol Herbal Essence Moisturizing Lotion, Normal Skin  Curel Age Defying Therapeutic Moisturizing Lotion with Alpha Hydroxy  Curel Extreme Care Body Lotion  Curel Soothing Hands Moisturizing Hand Lotion  Curel Therapeutic Moisturizing Cream, Fragrance-Free  Curel Therapeutic Moisturizing Lotion, Fragrance-Free  Curel Therapeutic Moisturizing Lotion, Original Formula  Eucerin Daily Replenishing Lotion  Eucerin Dry Skin Therapy Plus Alpha Hydroxy Crme  Eucerin Dry Skin Therapy Plus Alpha Hydroxy Lotion  Eucerin Original Crme  Eucerin Original Lotion  Eucerin Plus Crme Eucerin Plus Lotion  Eucerin TriLipid Replenishing Lotion  Keri Anti-Bacterial Hand Lotion  Keri Deep Conditioning Original Lotion Dry Skin Formula Softly Scented  Keri Deep Conditioning Original Lotion, Fragrance Free Sensitive Skin Formula  Keri Lotion Fast Absorbing Fragrance Free Sensitive Skin Formula  Keri Lotion Fast Absorbing Softly Scented Dry Skin Formula  Keri Original Lotion  Keri Skin Renewal Lotion Keri Silky Smooth Lotion  Keri Silky Smooth Sensitive Skin Lotion  Nivea Body Creamy Conditioning Oil  Nivea Body Extra Enriched Lotion  Nivea Body Original Lotion  Nivea Body Sheer Moisturizing Lotion Nivea Crme  Nivea Skin Firming Lotion  NutraDerm 30 Skin Lotion  NutraDerm Skin Lotion  NutraDerm Therapeutic Skin Cream  NutraDerm Therapeutic Skin Lotion  ProShield Protective Hand Cream  Provon moisturizing lotion  Please read over the following fact sheets that you were given.

## 2023-09-16 ENCOUNTER — Other Ambulatory Visit: Payer: Self-pay

## 2023-09-16 ENCOUNTER — Encounter (HOSPITAL_COMMUNITY): Payer: Self-pay

## 2023-09-16 ENCOUNTER — Encounter (HOSPITAL_COMMUNITY)
Admission: RE | Admit: 2023-09-16 | Discharge: 2023-09-16 | Disposition: A | Discharge status: Home / Self Care | Payer: 59 | Source: Ambulatory Visit | Attending: Orthopedic Surgery | Admitting: Orthopedic Surgery

## 2023-09-16 VITALS — BP 130/78 | HR 68 | Temp 97.8°F | Resp 20 | Ht 74.0 in | Wt 259.4 lb

## 2023-09-16 DIAGNOSIS — Z01812 Encounter for preprocedural laboratory examination: Secondary | ICD-10-CM | POA: Insufficient documentation

## 2023-09-16 DIAGNOSIS — E119 Type 2 diabetes mellitus without complications: Secondary | ICD-10-CM | POA: Diagnosis not present

## 2023-09-16 DIAGNOSIS — Z01818 Encounter for other preprocedural examination: Secondary | ICD-10-CM

## 2023-09-16 HISTORY — DX: Peripheral vascular disease, unspecified: I73.9

## 2023-09-16 LAB — TYPE AND SCREEN
ABO/RH(D): O POS
Antibody Screen: NEGATIVE

## 2023-09-16 LAB — HEPATIC FUNCTION PANEL
ALT: 59 U/L — ABNORMAL HIGH (ref 0–44)
AST: 29 U/L (ref 15–41)
Albumin: 3.6 g/dL (ref 3.5–5.0)
Alkaline Phosphatase: 53 U/L (ref 38–126)
Bilirubin, Direct: 0.1 mg/dL (ref 0.0–0.2)
Indirect Bilirubin: 0.4 mg/dL (ref 0.3–0.9)
Total Bilirubin: 0.5 mg/dL (ref 0.0–1.2)
Total Protein: 6.4 g/dL — ABNORMAL LOW (ref 6.5–8.1)

## 2023-09-16 LAB — SURGICAL PCR SCREEN
MRSA, PCR: NEGATIVE
Staphylococcus aureus: POSITIVE — AB

## 2023-09-16 LAB — CBC
HCT: 46.5 % (ref 39.0–52.0)
Hemoglobin: 15.3 g/dL (ref 13.0–17.0)
MCH: 29.7 pg (ref 26.0–34.0)
MCHC: 32.9 g/dL (ref 30.0–36.0)
MCV: 90.3 fL (ref 80.0–100.0)
Platelets: 264 10*3/uL (ref 150–400)
RBC: 5.15 MIL/uL (ref 4.22–5.81)
RDW: 12.7 % (ref 11.5–15.5)
WBC: 8.3 10*3/uL (ref 4.0–10.5)
nRBC: 0 % (ref 0.0–0.2)

## 2023-09-16 LAB — BASIC METABOLIC PANEL
Anion gap: 12 (ref 5–15)
BUN: 18 mg/dL (ref 6–20)
CO2: 21 mmol/L — ABNORMAL LOW (ref 22–32)
Calcium: 10.5 mg/dL — ABNORMAL HIGH (ref 8.9–10.3)
Chloride: 102 mmol/L (ref 98–111)
Creatinine, Ser: 1.31 mg/dL — ABNORMAL HIGH (ref 0.61–1.24)
GFR, Estimated: >60 mL/min (ref >60)
Glucose, Bld: 199 mg/dL — ABNORMAL HIGH (ref 70–99)
Potassium: 4.1 mmol/L (ref 3.5–5.1)
Sodium: 135 mmol/L (ref 135–145)

## 2023-09-16 LAB — HEMOGLOBIN A1C
Hgb A1c MFr Bld: 7.6 % — ABNORMAL HIGH (ref 4.8–5.6)
Mean Plasma Glucose: 171.42 mg/dL

## 2023-09-16 LAB — GLUCOSE, CAPILLARY: Glucose-Capillary: 194 mg/dL — ABNORMAL HIGH (ref 70–99)

## 2023-09-16 NOTE — Telephone Encounter (Signed)
Pt to see Jari Favre, PA-C 09/17/23. Will fwd to her and remove from preop pool. Tereso Newcomer, PA-C    09/16/2023 10:23 AM

## 2023-09-16 NOTE — Telephone Encounter (Signed)
Per Tereso Newcomer, PA-C, Jari Favre, NP, agreed to change pt's appointment 09/17/23 to a mychart video.  Per pt, he consents to video visit.     Patient Consent for Virtual Visit        Gregory Khan has provided verbal consent on 09/16/2023 for a virtual visit (video or telephone).   CONSENT FOR VIRTUAL VISIT FOR:  Gregory Khan  By participating in this virtual visit I agree to the following:  I hereby voluntarily request, consent and authorize [facility] and its employed or contracted physicians, physician assistants, nurse practitioners or other licensed health care professionals (the Practitioner), to provide me with telemedicine health care services (the "Services") as deemed necessary by the treating Practitioner. I acknowledge and consent to receive the Services by the Practitioner via telemedicine. I understand that the telemedicine visit will involve communicating with the Practitioner through live audiovisual communication technology and the disclosure of certain medical information by electronic transmission. I acknowledge that I have been given the opportunity to request an in-person assessment or other available alternative prior to the telemedicine visit and am voluntarily participating in the telemedicine visit.  I understand that I have the right to withhold or withdraw my consent to the use of telemedicine in the course of my care at any time, without affecting my right to future care or treatment, and that the Practitioner or I may terminate the telemedicine visit at any time. I understand that I have the right to inspect all information obtained and/or recorded in the course of the telemedicine visit and may receive copies of available information for a reasonable fee.  I understand that some of the potential risks of receiving the Services via telemedicine include:  Delay or interruption in medical evaluation due to technological equipment failure or  disruption; Information transmitted may not be sufficient (e.g. poor resolution of images) to allow for appropriate medical decision making by the Practitioner; and/or  In rare instances, security protocols could fail, causing a breach of personal health information.  Furthermore, I acknowledge that it is my responsibility to provide information about my medical history, conditions and care that is complete and accurate to the best of my ability. I acknowledge that Practitioner's advice, recommendations, and/or decision may be based on factors not within their control, such as incomplete or inaccurate data provided by me or distortions of diagnostic images or specimens that may result from electronic transmissions. I understand that the practice of medicine is not an exact science and that Practitioner makes no warranties or guarantees regarding treatment outcomes. I acknowledge that a copy of this consent can be made available to me via my patient portal [facility] MyChart), or I can request a printed copy by calling the office of [facility].    I understand that my insurance will be billed for this visit.   I have read or had this consent read to me. I understand the contents of this consent, which adequately explains the benefits and risks of the Services being provided via telemedicine.  I have been provided ample opportunity to ask questions regarding this consent and the Services and have had my questions answered to my satisfaction. I give my informed consent for the services to be provided through the use of telemedicine in my medical care

## 2023-09-16 NOTE — Telephone Encounter (Signed)
I will d/w the pre op APP today. I do not see a recent clearance request. The last request I see for pre op clearance is from 04/2023, which is the same surgery. I will also need to reach out to the surgeon office to confirm this information as it does not look like there was a new request sent to preop.

## 2023-09-16 NOTE — Progress Notes (Signed)
PCP - Dr. Shelly Flatten Cardiologist - Dr. Nicki Guadalajara - Last office visit 12/21/2022  PPM/ICD - Denies Device Orders - n/a Rep Notified - n/a  Chest x-ray - n/a EKG - 12/21/2022 Stress Test - Denies ECHO - 01/29/2017 Cardiac Cath - Denies  Sleep Study - +OSA and wore CPAP nightly up until a few months ago. Stopped wearing it due to back pain and has difficult time getting comfortable  Pt is DM2. He checks his blood sugar 1-2x/day. Normal fasting is around 110. CBG at pre-op 194. A1c result pending. Pt does note that he was taking steroids for part of pain management and had high blood sugars.  Last dose of GLP1 agonist- n/a GLP1 instructions: n/a  Blood Thinner Instructions: n/a Aspirin Instructions: n/a  ERAS Protcol - Clear liquids until 0900 morning of surgery PRE-SURGERY Ensure or G2- G2 given to pt with instructions  COVID TEST- n/a   Anesthesia review: Yes. Cardiac clearance   Patient denies shortness of breath, fever, cough and chest pain at PAT appointment. Pt denies any respiratory illness/infection in the last two months.   All instructions explained to the patient, with a verbal understanding of the material. Patient agrees to go over the instructions while at home for a better understanding. Patient also instructed to self quarantine after being tested for COVID-19. The opportunity to ask questions was provided.

## 2023-09-16 NOTE — Telephone Encounter (Signed)
I left a message for Lupita Leash, surgery scheduler for Dr. Yevette Edwards to call our office to confirm about surgery for 09/19/23.

## 2023-09-17 ENCOUNTER — Ambulatory Visit: Payer: 59 | Attending: Physician Assistant | Admitting: Physician Assistant

## 2023-09-17 VITALS — BP 132/85 | HR 68 | Ht 74.0 in | Wt 257.0 lb

## 2023-09-17 DIAGNOSIS — I10 Essential (primary) hypertension: Secondary | ICD-10-CM | POA: Diagnosis not present

## 2023-09-17 DIAGNOSIS — Z01810 Encounter for preprocedural cardiovascular examination: Secondary | ICD-10-CM

## 2023-09-17 NOTE — Patient Instructions (Signed)
Medication Instructions:  Your physician recommends that you continue on your current medications as directed. Please refer to the Current Medication list given to you today.  *If you need a refill on your cardiac medications before your next appointment, please call your pharmacy*   Lab Work: None ordered  If you have labs (blood work) drawn today and your tests are completely normal, you will receive your results only by: MyChart Message (if you have MyChart) OR A paper copy in the mail If you have any lab test that is abnormal or we need to change your treatment, we will call you to review the results.   Testing/Procedures: None ordered   Follow-Up: At [facility], you and your health needs are our priority.  As part of our continuing mission to provide you with exceptional heart care, we have created designated Provider Care Teams.  These Care Teams include your primary Cardiologist (physician) and Advanced Practice Providers (APPs -  Physician Assistants and Nurse Practitioners) who all work together to provide you with the care you need, when you need it.  We recommend signing up for the patient portal called "MyChart".  Sign up information is provided on this After Visit Summary.  MyChart is used to connect with patients for Virtual Visits (Telemedicine).  Patients are able to view lab/test results, encounter notes, upcoming appointments, etc.  Non-urgent messages can be sent to your provider as well.   To learn more about what you can do with MyChart, go to [email or web address].    Your next appointment:   As scheduled  Provider:   Nicki Guadalajara, MD     Other Instructions   1st Floor: - Lobby - Registration  - Pharmacy  - Lab - Cafe  2nd Floor: - PV Lab - Diagnostic Testing (echo, CT, nuclear med)  3rd Floor: - Vacant  4th Floor: - TCTS (cardiothoracic surgery) - AFib Clinic - Structural Heart Clinic - Vascular Surgery  - Vascular  Ultrasound  5th Floor: - HeartCare Cardiology (general and EP) - Clinical Pharmacy for coumadin, hypertension, lipid, weight-loss medications, and med management appointments    Valet parking services will be available as well.

## 2023-09-17 NOTE — Progress Notes (Signed)
Virtual Visit via Telephone Note   Because of Gregory Khan's co-morbid illnesses, he is at least at moderate risk for complications without adequate follow up.  This format is felt to be most appropriate for this patient at this time.  The patient did not have access to video technology/had technical difficulties with video requiring transitioning to audio format only (telephone).  All issues noted in this document were discussed and addressed.  No physical exam could be performed with this format.  Please refer to the patient's chart for his consent to telehealth for [facility].  Evaluation Performed:  Preoperative cardiovascular risk assessment _____________   Date:  09/17/2023   Patient ID:  Gregory Khan, DOB 1966/04/08, MRN [id number] Patient Location:  Home Provider location:   Office  Primary Care Provider:  Ozella Rocks, MD Primary Cardiologist:  Nicki Guadalajara, MD  Chief Complaint / Patient Profile   58 y.o. y/o male with a h/o greater than 15-year history of hypertension as well as a 3-year history of diabetes mellitus type 2 as well as hyperlipidemia who is pending left-sided L4-L5 transforaminal lumbar interbody fusion and decompression with instrumentation and allograft and presents today for telephonic preoperative cardiovascular risk assessment.  History of Present Illness    Gregory Khan is a 58 y.o. male who presents via audio/video conferencing for a telehealth visit today.  Pt was last seen in cardiology clinic on 12/21/2022 by Dr. Tresa Endo.  At that time Gregory Khan was doing well .  The patient is now pending procedure as outlined above. Since his last visit, he  presents with a history of diabetes and back problems, presents with significant back pain that has been affecting his mobility and daily activities for about ten days. The patient reports being able to move around the house and manage stairs with difficulty, but is unable to walk one to two  blocks. Household tasks are limited, with the patient able to manage light tasks such as moving to the restroom and sink, and putting up dishes, but unable to perform heavier chores like vacuuming. The patient's diabetes was previously well-controlled with an A1c of 6.8, but a recent test showed an increase to 7.6, which the patient attributes to steroid medication. The patient is scheduled for back surgery in the near future and is looking forward to starting rehabilitation post-surgery.  Reports no shortness of breath nor dyspnea on exertion. Reports no chest pain, pressure, or tightness. No edema, orthopnea, PND. Reports no palpitations.   Discussed the use of AI scribe software for clinical note transcription with the patient, who gave verbal consent to proceed.  No medications indicated as needing held.   Past Medical History    Past Medical History:  Diagnosis Date   Arthritis    Hx. Bilateral Knee Replacement   Diabetes mellitus without complication (HCC)    Type 2   Hyperlipidemia    Hypertension    Obesity    Peripheral vascular disease (HCC)    Thoracic Aneurysm   Past Surgical History:  Procedure Laterality Date   COLONOSCOPY  2017   KNEE ARTHROPLASTY Bilateral 2018   Seperate surgeries   LUMBAR LAMINECTOMY/DECOMPRESSION MICRODISCECTOMY Left 05/15/2023   Procedure: LEFT-SIDED LUMBAR FOUR - LUMBAR FIVE MICRODISECTOMY;  Surgeon: Estill Bamberg, MD;  Location: [facility] OR;  Service: Orthopedics;  Laterality: Left;   WISDOM TOOTH EXTRACTION      Allergies  No Known Allergies  Home Medications    Prior to Admission medications  Medication Sig Start Date End Date Taking? Authorizing Provider  acetaminophen (TYLENOL) 500 MG tablet Take 1,000 mg by mouth every 6 (six) hours as needed for moderate pain.    [provider]  amLODipine (NORVASC) 10 MG tablet Take 1 tablet (10 mg total) by mouth daily. 12/25/22   Lennette Bihari, MD  atorvastatin (LIPITOR) 40 MG tablet Take  1 tablet (40 mg total) by mouth daily. 11/05/22   Lennette Bihari, MD  gabapentin (NEURONTIN) 300 MG capsule Take 300 mg by mouth 3 (three) times daily. 09/06/23   [provider]  glyBURIDE (DIABETA) 2.5 MG tablet Take 1 tablet (2.5 mg total) by mouth 2 (two) times daily with a meal. Patient taking differently: Take 2.5 mg by mouth in the morning and at bedtime. 09/13/17   Lennette Bihari, MD  lisinopril (ZESTRIL) 40 MG tablet TAKE 1 TABLET BY MOUTH EVERY DAY 08/22/23   Lennette Bihari, MD  metFORMIN (GLUCOPHAGE-XR) 500 MG 24 hr tablet Take 1,000 mg by mouth 2 (two) times daily.    [provider]  metoprolol succinate (TOPROL-XL) 100 MG 24 hr tablet TAKE 1 TABLET BY MOUTH DAILY. TAKE WITH OR IMMEDIATELY FOLLOWING A MEAL. 02/26/23   Lennette Bihari, MD  oxyCODONE-acetaminophen (PERCOCET/ROXICET) 5-325 MG tablet Take 1 tablet by mouth every 6 (six) hours as needed (pain.). 09/07/23   [provider]  spironolactone (ALDACTONE) 25 MG tablet TAKE 1 TABLET (25 MG TOTAL) BY MOUTH DAILY. 08/22/23   Lennette Bihari, MD    Physical Exam    Vital Signs:  Gregory Khan does not have vital signs available for review today.  Given telephonic nature of communication, physical exam is limited. AAOx3. NAD. Normal affect.  Speech and respirations are unlabored.  Accessory Clinical Findings    None  Assessment & Plan    1.  Preoperative Cardiovascular Risk Assessment:  Gregory Khan perioperative risk of a major cardiac event is 0.4% according to the Revised Cardiac Risk Index (RCRI).  Therefore, he is at low risk for perioperative complications.   His functional capacity is fair at 4.86 METs according to the Duke Activity Status Index (DASI). Recommendations: According to ACC/AHA guidelines, no further cardiovascular testing needed.  The patient may proceed to surgery at acceptable risk.    Hypertension -continue current medications -well controlled at home per patient   The  patient was advised that if he develops new symptoms prior to surgery to contact our office to arrange for a follow-up visit, and he verbalized understanding.  A copy of this note will be routed to requesting surgeon.  Time:   Today, I have spent 9 minutes with the patient with telehealth technology discussing medical history, symptoms, and management plan.     Sharlene Dory, PA-C  09/17/2023, 1:37 PM

## 2023-09-19 ENCOUNTER — Observation Stay (HOSPITAL_COMMUNITY)
Admission: RE | Admit: 2023-09-19 | Discharge: 2023-09-20 | Disposition: A | Discharge status: Home / Self Care | Payer: 59 | Attending: Orthopedic Surgery | Admitting: Orthopedic Surgery

## 2023-09-19 ENCOUNTER — Other Ambulatory Visit: Payer: Self-pay

## 2023-09-19 ENCOUNTER — Ambulatory Visit (HOSPITAL_COMMUNITY): Payer: 59 | Admitting: Physician Assistant

## 2023-09-19 ENCOUNTER — Ambulatory Visit (HOSPITAL_BASED_OUTPATIENT_CLINIC_OR_DEPARTMENT_OTHER): Payer: 59 | Admitting: Physician Assistant

## 2023-09-19 ENCOUNTER — Ambulatory Visit (HOSPITAL_COMMUNITY)
Admission: RE | Disposition: A | Discharge status: Home / Self Care | Payer: Self-pay | Source: Home / Self Care | Attending: Orthopedic Surgery

## 2023-09-19 ENCOUNTER — Ambulatory Visit (HOSPITAL_COMMUNITY): Payer: 59

## 2023-09-19 ENCOUNTER — Encounter (HOSPITAL_COMMUNITY): Payer: Self-pay | Admitting: Orthopedic Surgery

## 2023-09-19 DIAGNOSIS — Z7982 Long term (current) use of aspirin: Secondary | ICD-10-CM | POA: Insufficient documentation

## 2023-09-19 DIAGNOSIS — M5116 Intervertebral disc disorders with radiculopathy, lumbar region: Principal | ICD-10-CM | POA: Insufficient documentation

## 2023-09-19 DIAGNOSIS — E119 Type 2 diabetes mellitus without complications: Secondary | ICD-10-CM | POA: Insufficient documentation

## 2023-09-19 DIAGNOSIS — I10 Essential (primary) hypertension: Secondary | ICD-10-CM | POA: Diagnosis not present

## 2023-09-19 DIAGNOSIS — M5136 Other intervertebral disc degeneration, lumbar region with discogenic back pain only: Secondary | ICD-10-CM

## 2023-09-19 DIAGNOSIS — Z96653 Presence of artificial knee joint, bilateral: Secondary | ICD-10-CM | POA: Insufficient documentation

## 2023-09-19 DIAGNOSIS — M5416 Radiculopathy, lumbar region: Principal | ICD-10-CM | POA: Diagnosis present

## 2023-09-19 DIAGNOSIS — Z7984 Long term (current) use of oral hypoglycemic drugs: Secondary | ICD-10-CM | POA: Diagnosis not present

## 2023-09-19 DIAGNOSIS — Z79899 Other long term (current) drug therapy: Secondary | ICD-10-CM | POA: Insufficient documentation

## 2023-09-19 HISTORY — PX: TRANSFORAMINAL LUMBAR INTERBODY FUSION (TLIF) WITH PEDICLE SCREW FIXATION 1 LEVEL: SHX6141

## 2023-09-19 LAB — ABO/RH: ABO/RH(D): O POS

## 2023-09-19 LAB — GLUCOSE, CAPILLARY
Glucose-Capillary: 199 mg/dL — ABNORMAL HIGH (ref 70–99)
Glucose-Capillary: 158 mg/dL — ABNORMAL HIGH (ref 70–99)
Glucose-Capillary: 243 mg/dL — ABNORMAL HIGH (ref 70–99)
Glucose-Capillary: 216 mg/dL — ABNORMAL HIGH (ref 70–99)

## 2023-09-19 SURGERY — TRANSFORAMINAL LUMBAR INTERBODY FUSION (TLIF) WITH PEDICLE SCREW FIXATION 1 LEVEL
Anesthesia: General | Laterality: Left

## 2023-09-19 MED ORDER — PROPOFOL 10 MG/ML IV BOLUS
INTRAVENOUS | Status: AC
  Filled 2023-09-19: qty 20

## 2023-09-19 MED ORDER — ACETAMINOPHEN 325 MG PO TABS: 650.0000 mg | ORAL_TABLET | ORAL | Status: DC | PRN

## 2023-09-19 MED ORDER — ALUM & MAG HYDROXIDE-SIMETH 200-200-20 MG/5ML PO SUSP
30.0000 mL | Freq: Four times a day (QID) | ORAL | Status: DC | PRN
Start: 2023-09-19 — End: 2023-09-20
  Administered 2023-09-20: 30 mL via ORAL
  Filled 2023-09-19: qty 30

## 2023-09-19 MED ORDER — THROMBIN (RECOMBINANT) 20000 UNITS EX SOLR
CUTANEOUS | Status: AC
  Filled 2023-09-19: qty 20000

## 2023-09-19 MED ORDER — INSULIN ASPART 100 UNIT/ML IJ SOLN
0.0000 [IU] | INTRAMUSCULAR | Status: DC | PRN
  Administered 2023-09-19: 4 [IU] via SUBCUTANEOUS

## 2023-09-19 MED ORDER — BUPIVACAINE LIPOSOME 1.3 % IJ SUSP
INTRAMUSCULAR | Status: DC | PRN
  Administered 2023-09-19: 20 mL

## 2023-09-19 MED ORDER — SODIUM CHLORIDE 0.9 % IV SOLN: 12.5000 mg | INTRAVENOUS | Status: DC | PRN

## 2023-09-19 MED ORDER — CEFAZOLIN SODIUM-DEXTROSE 2-4 GM/100ML-% IV SOLN
2.0000 g | Freq: Three times a day (TID) | INTRAVENOUS | Status: AC
  Administered 2023-09-19 – 2023-09-20 (×2): 2 g via INTRAVENOUS
  Filled 2023-09-19 (×2): qty 100

## 2023-09-19 MED ORDER — SODIUM CHLORIDE 0.9% FLUSH: 3.0000 mL | Freq: Two times a day (BID) | INTRAVENOUS | Status: DC

## 2023-09-19 MED ORDER — DEXAMETHASONE SODIUM PHOSPHATE 10 MG/ML IJ SOLN
INTRAMUSCULAR | Status: AC
  Filled 2023-09-19: qty 1

## 2023-09-19 MED ORDER — HYDROMORPHONE HCL 1 MG/ML IJ SOLN
INTRAMUSCULAR | Status: AC
  Filled 2023-09-19: qty 1

## 2023-09-19 MED ORDER — ATORVASTATIN CALCIUM 40 MG PO TABS
40.0000 mg | ORAL_TABLET | Freq: Every day | ORAL | Status: DC
  Administered 2023-09-20: 40 mg via ORAL
  Filled 2023-09-19: qty 1

## 2023-09-19 MED ORDER — AMISULPRIDE (ANTIEMETIC) 5 MG/2ML IV SOLN: 10.0000 mg | Freq: Once | INTRAVENOUS | Status: DC | PRN

## 2023-09-19 MED ORDER — LACTATED RINGERS IV SOLN: INTRAVENOUS | Status: DC

## 2023-09-19 MED ORDER — METFORMIN HCL ER 500 MG PO TB24
1000.0000 mg | ORAL_TABLET | Freq: Two times a day (BID) | ORAL | Status: DC
  Administered 2023-09-19 – 2023-09-20 (×2): 1000 mg via ORAL
  Filled 2023-09-19 (×2): qty 2

## 2023-09-19 MED ORDER — SODIUM CHLORIDE 0.9% FLUSH
3.0000 mL | INTRAVENOUS | Status: DC | PRN
Start: 2023-09-19 — End: 2023-09-20

## 2023-09-19 MED ORDER — AMLODIPINE BESYLATE 10 MG PO TABS
10.0000 mg | ORAL_TABLET | Freq: Every day | ORAL | Status: DC
  Administered 2023-09-20: 10 mg via ORAL
  Filled 2023-09-19: qty 1

## 2023-09-19 MED ORDER — CHLORHEXIDINE GLUCONATE 0.12 % MT SOLN
OROMUCOSAL | Status: AC
  Filled 2023-09-19: qty 15

## 2023-09-19 MED ORDER — GLYBURIDE 2.5 MG PO TABS
2.5000 mg | ORAL_TABLET | Freq: Two times a day (BID) | ORAL | Status: DC
  Administered 2023-09-19 – 2023-09-20 (×2): 2.5 mg via ORAL
  Filled 2023-09-19 (×3): qty 1

## 2023-09-19 MED ORDER — ACETAMINOPHEN 650 MG RE SUPP: 650.0000 mg | RECTAL | Status: DC | PRN

## 2023-09-19 MED ORDER — MORPHINE SULFATE (PF) 2 MG/ML IV SOLN: 1.0000 mg | INTRAVENOUS | Status: DC | PRN

## 2023-09-19 MED ORDER — ROCURONIUM BROMIDE 10 MG/ML (PF) SYRINGE
PREFILLED_SYRINGE | INTRAVENOUS | Status: AC
  Filled 2023-09-19: qty 10

## 2023-09-19 MED ORDER — ONDANSETRON HCL 4 MG PO TABS
4.0000 mg | ORAL_TABLET | Freq: Four times a day (QID) | ORAL | Status: DC | PRN
Start: 2023-09-19 — End: 2023-09-20

## 2023-09-19 MED ORDER — POVIDONE-IODINE 7.5 % EX SOLN
Freq: Once | CUTANEOUS | Status: DC
  Filled 2023-09-19: qty 118

## 2023-09-19 MED ORDER — GABAPENTIN 300 MG PO CAPS
300.0000 mg | ORAL_CAPSULE | Freq: Three times a day (TID) | ORAL | Status: DC
  Administered 2023-09-19 – 2023-09-20 (×2): 300 mg via ORAL
  Filled 2023-09-19 (×2): qty 1

## 2023-09-19 MED ORDER — ONDANSETRON HCL 4 MG/2ML IJ SOLN
INTRAMUSCULAR | Status: AC
  Filled 2023-09-19: qty 2

## 2023-09-19 MED ORDER — MENTHOL 3 MG MT LOZG: 1.0000 | LOZENGE | OROMUCOSAL | Status: DC | PRN

## 2023-09-19 MED ORDER — 0.9 % SODIUM CHLORIDE (POUR BTL) OPTIME
TOPICAL | Status: DC | PRN
  Administered 2023-09-19 (×2): 1000 mL

## 2023-09-19 MED ORDER — PHENYLEPHRINE HCL-NACL 20-0.9 MG/250ML-% IV SOLN
INTRAVENOUS | Status: DC | PRN
  Administered 2023-09-19: 50 ug/min via INTRAVENOUS

## 2023-09-19 MED ORDER — MIDAZOLAM HCL 2 MG/2ML IJ SOLN
INTRAMUSCULAR | Status: AC
  Filled 2023-09-19: qty 2

## 2023-09-19 MED ORDER — INSULIN ASPART 100 UNIT/ML IJ SOLN
0.0000 [IU] | Freq: Every day | INTRAMUSCULAR | Status: DC
  Administered 2023-09-19: 2 [IU] via SUBCUTANEOUS

## 2023-09-19 MED ORDER — MIDAZOLAM HCL 2 MG/2ML IJ SOLN
INTRAMUSCULAR | Status: DC | PRN
  Administered 2023-09-19: 2 mg via INTRAVENOUS

## 2023-09-19 MED ORDER — HYDROMORPHONE HCL 1 MG/ML IJ SOLN
0.2500 mg | INTRAMUSCULAR | Status: DC | PRN
  Administered 2023-09-19 (×4): 0.5 mg via INTRAVENOUS

## 2023-09-19 MED ORDER — ONDANSETRON HCL 4 MG/2ML IJ SOLN: 4.0000 mg | Freq: Four times a day (QID) | INTRAMUSCULAR | Status: DC | PRN

## 2023-09-19 MED ORDER — THROMBIN 20000 UNITS EX SOLR
CUTANEOUS | Status: DC | PRN
  Administered 2023-09-19: 20 mL via TOPICAL

## 2023-09-19 MED ORDER — BUPIVACAINE LIPOSOME 1.3 % IJ SUSP
INTRAMUSCULAR | Status: AC
  Filled 2023-09-19: qty 20

## 2023-09-19 MED ORDER — SODIUM CHLORIDE 0.9% FLUSH: 3.0000 mL | INTRAVENOUS | Status: DC | PRN

## 2023-09-19 MED ORDER — LIDOCAINE 2% (20 MG/ML) 5 ML SYRINGE
INTRAMUSCULAR | Status: AC
  Filled 2023-09-19: qty 5

## 2023-09-19 MED ORDER — HYDROCODONE-ACETAMINOPHEN 5-325 MG PO TABS: 1.0000 | ORAL_TABLET | ORAL | Status: DC | PRN

## 2023-09-19 MED ORDER — INSULIN ASPART 100 UNIT/ML IJ SOLN
INTRAMUSCULAR | Status: AC
  Filled 2023-09-19: qty 1

## 2023-09-19 MED ORDER — SENNOSIDES-DOCUSATE SODIUM 8.6-50 MG PO TABS: 1.0000 | ORAL_TABLET | Freq: Every evening | ORAL | Status: DC | PRN

## 2023-09-19 MED ORDER — OXYCODONE HCL 5 MG PO TABS
ORAL_TABLET | ORAL | Status: AC
  Filled 2023-09-19: qty 1

## 2023-09-19 MED ORDER — ORAL CARE MOUTH RINSE: 15.0000 mL | Freq: Once | OROMUCOSAL | Status: AC

## 2023-09-19 MED ORDER — OXYCODONE HCL 5 MG PO TABS
5.0000 mg | ORAL_TABLET | Freq: Once | ORAL | Status: AC | PRN
  Administered 2023-09-19: 5 mg via ORAL

## 2023-09-19 MED ORDER — SUGAMMADEX SODIUM 200 MG/2ML IV SOLN
INTRAVENOUS | Status: DC | PRN
  Administered 2023-09-19: 200 mg via INTRAVENOUS

## 2023-09-19 MED ORDER — METHOCARBAMOL 1000 MG/10ML IJ SOLN: 500.0000 mg | Freq: Four times a day (QID) | INTRAMUSCULAR | Status: DC | PRN

## 2023-09-19 MED ORDER — EPHEDRINE SULFATE (PRESSORS) 50 MG/ML IJ SOLN
INTRAMUSCULAR | Status: DC | PRN
  Administered 2023-09-19: 5 mg via INTRAVENOUS

## 2023-09-19 MED ORDER — FLEET ENEMA RE ENEM: 1.0000 | ENEMA | Freq: Once | RECTAL | Status: DC | PRN

## 2023-09-19 MED ORDER — PROPOFOL 10 MG/ML IV BOLUS
INTRAVENOUS | Status: DC | PRN
  Administered 2023-09-19: 150 mg via INTRAVENOUS

## 2023-09-19 MED ORDER — BISACODYL 5 MG PO TBEC
5.0000 mg | DELAYED_RELEASE_TABLET | Freq: Every day | ORAL | Status: DC | PRN
Start: 2023-09-19 — End: 2023-09-20

## 2023-09-19 MED ORDER — BUPIVACAINE-EPINEPHRINE 0.25% -1:200000 IJ SOLN
INTRAMUSCULAR | Status: DC | PRN
  Administered 2023-09-19: 7 mL
  Administered 2023-09-19: 20 mL

## 2023-09-19 MED ORDER — CHLORHEXIDINE GLUCONATE 0.12 % MT SOLN
15.0000 mL | Freq: Once | OROMUCOSAL | Status: AC
Start: 2023-09-19 — End: 2023-09-19
  Administered 2023-09-19: 15 mL via OROMUCOSAL

## 2023-09-19 MED ORDER — CEFAZOLIN SODIUM-DEXTROSE 2-4 GM/100ML-% IV SOLN
INTRAVENOUS | Status: AC
  Filled 2023-09-19: qty 100

## 2023-09-19 MED ORDER — OXYCODONE HCL 5 MG/5ML PO SOLN: 5.0000 mg | Freq: Once | ORAL | Status: AC | PRN

## 2023-09-19 MED ORDER — BUPIVACAINE-EPINEPHRINE (PF) 0.25% -1:200000 IJ SOLN
INTRAMUSCULAR | Status: AC
  Filled 2023-09-19: qty 30

## 2023-09-19 MED ORDER — LIDOCAINE 2% (20 MG/ML) 5 ML SYRINGE
INTRAMUSCULAR | Status: DC | PRN
  Administered 2023-09-19: 100 mg via INTRAVENOUS

## 2023-09-19 MED ORDER — FENTANYL CITRATE (PF) 250 MCG/5ML IJ SOLN
INTRAMUSCULAR | Status: DC | PRN
  Administered 2023-09-19: 100 ug via INTRAVENOUS
  Administered 2023-09-19 (×3): 50 ug via INTRAVENOUS

## 2023-09-19 MED ORDER — PANTOPRAZOLE SODIUM 40 MG PO TBEC
40.0000 mg | DELAYED_RELEASE_TABLET | Freq: Every day | ORAL | Status: AC
  Administered 2023-09-19: 40 mg via ORAL
  Filled 2023-09-19: qty 1

## 2023-09-19 MED ORDER — EPHEDRINE 5 MG/ML INJ
INTRAVENOUS | Status: AC
  Filled 2023-09-19: qty 5

## 2023-09-19 MED ORDER — DEXAMETHASONE SODIUM PHOSPHATE 10 MG/ML IJ SOLN
INTRAMUSCULAR | Status: DC | PRN
  Administered 2023-09-19: 5 mg via INTRAVENOUS

## 2023-09-19 MED ORDER — INSULIN ASPART 100 UNIT/ML IJ SOLN
0.0000 [IU] | Freq: Three times a day (TID) | INTRAMUSCULAR | Status: DC
Start: 2023-09-20 — End: 2023-09-20
  Administered 2023-09-20: 2 [IU] via SUBCUTANEOUS

## 2023-09-19 MED ORDER — OXYCODONE-ACETAMINOPHEN 5-325 MG PO TABS
1.0000 | ORAL_TABLET | ORAL | Status: DC | PRN
  Administered 2023-09-19 – 2023-09-20 (×5): 2 via ORAL
  Filled 2023-09-19 (×5): qty 2

## 2023-09-19 MED ORDER — FENTANYL CITRATE (PF) 250 MCG/5ML IJ SOLN
INTRAMUSCULAR | Status: AC
  Filled 2023-09-19: qty 5

## 2023-09-19 MED ORDER — LISINOPRIL 20 MG PO TABS
40.0000 mg | ORAL_TABLET | Freq: Every day | ORAL | Status: DC
  Administered 2023-09-19 – 2023-09-20 (×2): 40 mg via ORAL
  Filled 2023-09-19 (×2): qty 2

## 2023-09-19 MED ORDER — PANTOPRAZOLE SODIUM 40 MG IV SOLR
40.0000 mg | Freq: Every day | INTRAVENOUS | Status: DC
Start: 2023-09-19 — End: 2023-09-19

## 2023-09-19 MED ORDER — ACETAMINOPHEN 10 MG/ML IV SOLN
INTRAVENOUS | Status: AC
  Filled 2023-09-19: qty 100

## 2023-09-19 MED ORDER — DOCUSATE SODIUM 100 MG PO CAPS
100.0000 mg | ORAL_CAPSULE | Freq: Two times a day (BID) | ORAL | Status: DC
  Administered 2023-09-19 – 2023-09-20 (×2): 100 mg via ORAL
  Filled 2023-09-19 (×2): qty 1

## 2023-09-19 MED ORDER — ALBUMIN HUMAN 5 % IV SOLN: INTRAVENOUS | Status: DC | PRN

## 2023-09-19 MED ORDER — METOPROLOL SUCCINATE ER 100 MG PO TB24
100.0000 mg | ORAL_TABLET | Freq: Every day | ORAL | Status: DC
  Administered 2023-09-20: 100 mg via ORAL
  Filled 2023-09-19: qty 1

## 2023-09-19 MED ORDER — CEFAZOLIN SODIUM-DEXTROSE 2-4 GM/100ML-% IV SOLN
2.0000 g | INTRAVENOUS | Status: AC
  Administered 2023-09-19: 2 g via INTRAVENOUS

## 2023-09-19 MED ORDER — SODIUM CHLORIDE 0.9 % IV SOLN
250.0000 mL | INTRAVENOUS | Status: DC
  Administered 2023-09-19: 250 mL via INTRAVENOUS

## 2023-09-19 MED ORDER — METHOCARBAMOL 500 MG PO TABS
500.0000 mg | ORAL_TABLET | Freq: Four times a day (QID) | ORAL | Status: DC | PRN
  Administered 2023-09-19 – 2023-09-20 (×3): 500 mg via ORAL
  Filled 2023-09-19 (×3): qty 1

## 2023-09-19 MED ORDER — MEPERIDINE HCL 25 MG/ML IJ SOLN: 6.2500 mg | INTRAMUSCULAR | Status: DC | PRN

## 2023-09-19 MED ORDER — ROCURONIUM BROMIDE 10 MG/ML (PF) SYRINGE
PREFILLED_SYRINGE | INTRAVENOUS | Status: DC | PRN
  Administered 2023-09-19: 10 mg via INTRAVENOUS
  Administered 2023-09-19: 100 mg via INTRAVENOUS
  Administered 2023-09-19: 20 mg via INTRAVENOUS
  Administered 2023-09-19 (×3): 10 mg via INTRAVENOUS

## 2023-09-19 MED ORDER — SPIRONOLACTONE 25 MG PO TABS
25.0000 mg | ORAL_TABLET | Freq: Every day | ORAL | Status: DC
  Administered 2023-09-20: 25 mg via ORAL
  Filled 2023-09-19: qty 1

## 2023-09-19 MED ORDER — ZOLPIDEM TARTRATE 5 MG PO TABS
5.0000 mg | ORAL_TABLET | Freq: Every evening | ORAL | Status: DC | PRN
Start: 2023-09-19 — End: 2023-09-20

## 2023-09-19 MED ORDER — PHENOL 1.4 % MT LIQD: 1.0000 | OROMUCOSAL | Status: DC | PRN

## 2023-09-19 MED ORDER — ACETAMINOPHEN 10 MG/ML IV SOLN
1000.0000 mg | Freq: Once | INTRAVENOUS | Status: AC
  Administered 2023-09-19: 1000 mg via INTRAVENOUS

## 2023-09-19 SURGICAL SUPPLY — 80 items
BAG COUNTER SPONGE SURGICOUNT (BAG) ×1 IMPLANT
BENZOIN TINCTURE PRP APPL 2/3 (GAUZE/BANDAGES/DRESSINGS) ×1 IMPLANT
BUR PRESCISION 1.7 ELITE (BURR) ×1 IMPLANT
BUR ROUND FLUTED 5 RND (BURR) ×1 IMPLANT
CAGE SABLE 10X26 6-12 8D (Cage) IMPLANT
CANNULA GRAFT BNE VG PRE-FILL (Bone Implant) IMPLANT
CLSR STERI-STRIP ANTIMIC 1/2X4 (GAUZE/BANDAGES/DRESSINGS) IMPLANT
CNTNR URN SCR LID CUP LEK RST (MISCELLANEOUS) ×1 IMPLANT
COVER BACK TABLE 60X90IN (DRAPES) ×1 IMPLANT
COVER MAYO STAND STRL (DRAPES) ×2 IMPLANT
COVER SURGICAL LIGHT HANDLE (MISCELLANEOUS) ×1 IMPLANT
DISPENSER GRAFT BNE VG (MISCELLANEOUS) IMPLANT
DISPENSER VIVIGEN BONE GRAFT (MISCELLANEOUS) ×1 IMPLANT
DRAPE C-ARM 42X72 X-RAY (DRAPES) ×1 IMPLANT
DRAPE POUCH INSTRU U-SHP 10X18 (DRAPES) ×1 IMPLANT
DRAPE SURG 17X23 STRL (DRAPES) ×4 IMPLANT
DRSG AQUACEL AG ADV 3.5X10 (GAUZE/BANDAGES/DRESSINGS) IMPLANT
DURAPREP 26ML APPLICATOR (WOUND CARE) ×1 IMPLANT
ELECT BLADE 4.0 EZ CLEAN MEGAD (MISCELLANEOUS) ×1 IMPLANT
ELECT CAUTERY BLADE 6.4 (BLADE) ×1 IMPLANT
ELECT REM PT RETURN 9FT ADLT (ELECTROSURGICAL) ×1 IMPLANT
ELECTRODE BLDE 4.0 EZ CLN MEGD (MISCELLANEOUS) ×1 IMPLANT
ELECTRODE REM PT RTRN 9FT ADLT (ELECTROSURGICAL) ×1 IMPLANT
FILTER STRAW FLUID ASPIR (MISCELLANEOUS) ×1 IMPLANT
GAUZE 4X4 16PLY ~~LOC~~+RFID DBL (SPONGE) ×1 IMPLANT
GAUZE SPONGE 4X4 12PLY STRL (GAUZE/BANDAGES/DRESSINGS) ×1 IMPLANT
GAUZE SPONGE 4X4 12PLY STRL LF (GAUZE/BANDAGES/DRESSINGS) IMPLANT
GLOVE BIO SURGEON STRL SZ 6.5 (GLOVE) ×1 IMPLANT
GLOVE BIO SURGEON STRL SZ8 (GLOVE) ×1 IMPLANT
GLOVE BIOGEL PI IND STRL 7.0 (GLOVE) ×1 IMPLANT
GLOVE BIOGEL PI IND STRL 8 (GLOVE) ×1 IMPLANT
GLOVE SURG ENC MOIS LTX SZ6.5 (GLOVE) ×1 IMPLANT
GOWN STRL REUS W/ TWL LRG LVL3 (GOWN DISPOSABLE) ×2 IMPLANT
GOWN STRL REUS W/ TWL XL LVL3 (GOWN DISPOSABLE) ×1 IMPLANT
GRAFT BONE CANNULA VIVIGEN 3 (Bone Implant) ×2 IMPLANT
IV CATH 14GX2 1/4 (CATHETERS) ×1 IMPLANT
KIT BASIN OR (CUSTOM PROCEDURE TRAY) ×1 IMPLANT
KIT POSITION SURG JACKSON T1 (MISCELLANEOUS) ×1 IMPLANT
KIT TURNOVER KIT B (KITS) ×1 IMPLANT
MARKER SKIN DUAL TIP RULER LAB (MISCELLANEOUS) ×2 IMPLANT
NDL 18GX1X1/2 (RX/OR ONLY) (NEEDLE) ×1 IMPLANT
NDL 22X1.5 STRL (OR ONLY) (MISCELLANEOUS) ×2 IMPLANT
NDL HYPO 25GX1X1/2 BEV (NEEDLE) ×1 IMPLANT
NDL SPNL 18GX3.5 QUINCKE PK (NEEDLE) ×2 IMPLANT
NEEDLE 18GX1X1/2 (RX/OR ONLY) (NEEDLE) ×1 IMPLANT
NEEDLE 22X1.5 STRL (OR ONLY) (MISCELLANEOUS) ×2 IMPLANT
NEEDLE HYPO 25GX1X1/2 BEV (NEEDLE) ×1 IMPLANT
NEEDLE SPNL 18GX3.5 QUINCKE PK (NEEDLE) ×2 IMPLANT
NS IRRIG 1000ML POUR BTL (IV SOLUTION) ×1 IMPLANT
PACK LAMINECTOMY ORTHO (CUSTOM PROCEDURE TRAY) ×1 IMPLANT
PACK UNIVERSAL I (CUSTOM PROCEDURE TRAY) ×1 IMPLANT
PAD ARMBOARD 7.5X6 YLW CONV (MISCELLANEOUS) ×2 IMPLANT
PATTIES SURGICAL .5 X1 (DISPOSABLE) ×1 IMPLANT
PATTIES SURGICAL .5X1.5 (GAUZE/BANDAGES/DRESSINGS) ×1 IMPLANT
PUTTY DBX 2.5CC (Putty) ×1 IMPLANT
PUTTY DBX 2.5CC DEPUY (Putty) IMPLANT
ROD PRE BENT EXP 40MM (Rod) IMPLANT
ROD PRE BENT EXPEDIUM 35MM (Rod) IMPLANT
SCREW CORTICAL VIPER 7X35 (Screw) IMPLANT
SCREW SET SINGLE INNER (Screw) IMPLANT
SCREW VIPER CORT FIX 6.00X30 (Screw) IMPLANT
SPONGE INTESTINAL PEANUT (DISPOSABLE) ×1 IMPLANT
SPONGE SURGIFOAM ABS GEL 100 (HEMOSTASIS) ×1 IMPLANT
STRIP CLOSURE SKIN 1/2X4 (GAUZE/BANDAGES/DRESSINGS) ×2 IMPLANT
SUT MNCRL AB 4-0 PS2 18 (SUTURE) ×1 IMPLANT
SUT VIC AB 0 CT1 18XCR BRD 8 (SUTURE) ×1 IMPLANT
SUT VIC AB 1 CT1 18XCR BRD 8 (SUTURE) ×1 IMPLANT
SUT VIC AB 2-0 CT2 18 VCP726D (SUTURE) ×1 IMPLANT
SYR 20ML LL LF (SYRINGE) ×2 IMPLANT
SYR BULB IRRIG 60ML STRL (SYRINGE) ×1 IMPLANT
SYR CONTROL 10ML LL (SYRINGE) ×2 IMPLANT
SYR TB 1ML LUER SLIP (SYRINGE) ×1 IMPLANT
TAP EXPEDIUM DL 5.0 (INSTRUMENTS) IMPLANT
TAP EXPEDIUM DL 6.0 (INSTRUMENTS) IMPLANT
TAP EXPEDIUM DL 7X2 (INSTRUMENTS) IMPLANT
TAPE CLOTH SURG 4X10 WHT LF (GAUZE/BANDAGES/DRESSINGS) IMPLANT
TRAY FOLEY MTR SLVR 16FR STAT (SET/KITS/TRAYS/PACK) ×1 IMPLANT
TUBE FUNNEL GL DISP (ORTHOPEDIC DISPOSABLE SUPPLIES) IMPLANT
WATER STERILE IRR 1000ML POUR (IV SOLUTION) ×1 IMPLANT
YANKAUER SUCT BULB TIP NO VENT (SUCTIONS) ×1 IMPLANT

## 2023-09-19 NOTE — H&P (Signed)
PREOPERATIVE H&P  Chief Complaint: Left leg pain  HPI: Gregory Khan is a 58 y.o. male who presents with ongoing pain in the left leg  MRI reveals a recurrent left-sided L4-5 disc herniation.  The patient is status post a microdiscectomy which occurred approximately 4 months ago.  He then had a significant recurrence of pain, and an updated MRI did reveal the findings noted above.  Patient has failed multiple forms of conservative care and continues to have pain (see office notes for additional details regarding the patient's full course of treatment)  Past Medical History:  Diagnosis Date   Arthritis    Hx. Bilateral Knee Replacement   Diabetes mellitus without complication (HCC)    Type 2   Hyperlipidemia    Hypertension    Obesity    Peripheral vascular disease (HCC)    Thoracic Aneurysm   Past Surgical History:  Procedure Laterality Date   COLONOSCOPY  2017   KNEE ARTHROPLASTY Bilateral 2018   Seperate surgeries   LUMBAR LAMINECTOMY/DECOMPRESSION MICRODISCECTOMY Left 05/15/2023   Procedure: LEFT-SIDED LUMBAR FOUR - LUMBAR FIVE MICRODISECTOMY;  Surgeon: Estill Bamberg, MD;  Location: [facility] OR;  Service: Orthopedics;  Laterality: Left;   WISDOM TOOTH EXTRACTION     Social History   Socioeconomic History   Marital status: Married    Spouse name: Not on file   Number of children: Not on file   Years of education: Not on file   Highest education level: Not on file  Occupational History   Not on file  Tobacco Use   Smoking status: Never   Smokeless tobacco: Never  Vaping Use   Vaping status: Never Used  Substance and Sexual Activity   Alcohol use: Yes    Comment: Weekends socially   Drug use: No   Sexual activity: Yes  Other Topics Concern   Not on file  Social History Narrative   Not on file   Social Drivers of Health   Financial Resource Strain: Not on file  Food Insecurity: Not on file  Transportation Needs: Not on file  Physical Activity: Not on  file  Stress: Not on file  Social Connections: Not on file   Family History  Problem Relation Age of Onset   Hypertension Father    Colon cancer Neg Hx    Colon polyps Neg Hx    No Known Allergies Prior to Admission medications   Medication Sig Start Date End Date Taking? Authorizing Provider  acetaminophen (TYLENOL) 500 MG tablet Take 1,000 mg by mouth every 6 (six) hours as needed for moderate pain.   Yes [provider]  amLODipine (NORVASC) 10 MG tablet Take 1 tablet (10 mg total) by mouth daily. 12/25/22  Yes Lennette Bihari, MD  atorvastatin (LIPITOR) 40 MG tablet Take 1 tablet (40 mg total) by mouth daily. 11/05/22  Yes Lennette Bihari, MD  gabapentin (NEURONTIN) 300 MG capsule Take 300 mg by mouth 3 (three) times daily. 09/06/23  Yes [provider]  glyBURIDE (DIABETA) 2.5 MG tablet Take 1 tablet (2.5 mg total) by mouth 2 (two) times daily with a meal. Patient taking differently: Take 2.5 mg by mouth in the morning and at bedtime. 09/13/17  Yes Lennette Bihari, MD  lisinopril (ZESTRIL) 40 MG tablet TAKE 1 TABLET BY MOUTH EVERY DAY 08/22/23  Yes Lennette Bihari, MD  metFORMIN (GLUCOPHAGE-XR) 500 MG 24 hr tablet Take 1,000 mg by mouth 2 (two) times daily.   Yes [provider]  metoprolol succinate (TOPROL-XL) 100 MG 24 hr tablet TAKE 1 TABLET BY MOUTH DAILY. TAKE WITH OR IMMEDIATELY FOLLOWING A MEAL. 02/26/23  Yes Lennette Bihari, MD  oxyCODONE-acetaminophen (PERCOCET/ROXICET) 5-325 MG tablet Take 1 tablet by mouth every 6 (six) hours as needed (pain.). 09/07/23  Yes [provider]  spironolactone (ALDACTONE) 25 MG tablet TAKE 1 TABLET (25 MG TOTAL) BY MOUTH DAILY. 08/22/23  Yes Lennette Bihari, MD     All other systems have been reviewed and were otherwise negative with the exception of those mentioned in the HPI and as above.  Physical Exam: Vitals:   09/19/23 0922  BP: (!) 120/92  Pulse: 96  Resp: 20  Temp: 98.2 F (36.8 C)  SpO2: 93%     Body mass index is 33 kg/m.  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

## 2023-09-19 NOTE — Op Note (Signed)
PATIENT NAME: Gregory Khan   MEDICAL RECORD NO.:   [id number]   DATE OF BIRTH: 1966/07/22   DATE OF PROCEDURE: 09/19/2023                               OPERATIVE REPORT     PREOPERATIVE DIAGNOSES: 1. Left-sided lumbar radiculopathy 2. Recurrent left L4-5 disc herniation, compressing the traversing left L5 nerve 3. Status post previous left-sided L4-5 microdiscectomy approximately 4 months ago   POSTOPERATIVE DIAGNOSES: 1. Left-sided lumbar radiculopathy 2. Recurrent left L4-5 disc herniation, compressing the traversing left L5 nerve 3. Status post previous left-sided L4-5 microdiscectomy approximately 4 months ago   PROCEDURES: 1. L4/5 revision decompression, including meticulous lysis of adhesions and removal of recurrent L4-5 disc herniation 2. Left-sided L4-5 transforaminal lumbar interbody fusion. 3. Right-sided L4-5 posterolateral fusion. 4. Insertion of interbody device x1 (Globus expandable intervertebral spacer). 5. Placement of segmental posterior instrumentation L4, L5 bilaterally  6. Use of local autograft. 7. Use of morselized allograft - Vivigen 8. Intraoperative use of fluoroscopy.   SURGEON:  Estill Bamberg, MD.   ASSISTANTJason Coop, PA-C.   ANESTHESIA:  General endotracheal anesthesia.   COMPLICATIONS:  None.   DISPOSITION:  Stable.   ESTIMATED BLOOD LOSS:  100cc   INDICATIONS FOR SURGERY:  Briefly,  Gregory Khan is a pleasant 58 year old male who is status post a left-sided microdiscectomy that occurred on 05/15/2023.  The patient did well following surgery, but then had a significant increase in left leg pain.  An updated MRI did reveal a large recurrent left-sided L4-5 disc herniation, resulting in severe left leg pain.  Given his MRI findings and his severe pain, we did discuss proceeding with a revision decompression and fusion procedure. The patient did wish to proceed with the procedure  noted above.   OPERATIVE DETAILS:  On 09/19/2023, the  patient was brought to surgery and general endotracheal anesthesia was administered.  The patient was placed prone on a well-padded flat Jackson bed with a spinal frame.  Antibiotics were given and a time-out procedure was performed. The back was prepped and draped in the usual fashion.  A midline incision was made overlying the L4-5 intervertebral spaces.  The fascia was incised at the midline.  The paraspinal musculature was bluntly swept laterally.  Anatomic landmarks for the pedicles were exposed. Using fluoroscopy, I did cannulate the L4 and L5 pedicles bilaterally, using a medial to lateral cortical trajectory technique.  At this point, 6 x 30 mm screws were placed into the right pedicles, and a 40 mm rod was placed into the tulip heads of the screws, and caps were also placed.  Distraction was then applied across the L4-5 intervertebral space, and the caps were then provisionally tightened.  On the left side, bone wax was placed into the cannulated pedicle holes.  I then proceeded with the decompressive aspect of the procedure at the L4-5 level.  A partial facetectomy was performed on the left at L4-5.  Immediately, the laminotomy defect from the patient's previous decompression was identified.  There were adhesions between the dura and the laminotomy defect.  These adhesions were released meticulously using a series of curettes.  I was then able to identify the traversing left L5 nerve, which was quite adherent to the disc herniation ventral to it.  Again, adhesions were identified and released, and I was able to gently medialize the traversing left L5 nerve.  With  ongoing gentle medialization of the nerve, I was able to identify in its entirety, the herniated disc fragment, after which point it was removed entirely.  With ongoing retraction of the nerve, I then performed an annulotomy at the posterolateral aspect of the L4-5 intervertebral space.  I then used a series of curettes and pituitary  rongeurs to perform a thorough and complete intervertebral diskectomy.  The intervertebral space was then liberally packed with autograft as well as allograft in the form of Vivigen, as was the appropriate-sized intervertebral spacer.  The spacer was then tamped into position in the usual fashion, and expanded to 9.4 mm in height. I was very pleased with the press-fit of the spacer.  I then placed 6 mm screws on the left at L4 and L5. A 40-mm rod was then placed and caps were placed. The distraction was then released on the contralateral side.  All caps were then locked.  The wound was copiously irrigated with a total of approximately 3 L prior to placing the bone graft.  Additional autograft and allograft was then packed into the posterolateral gutter on the right side to help aid in the success of the fusion.  The wound was  explored for any undue bleeding and there was no substantial bleeding encountered.  Gel-Foam was placed over the laminectomy site.  The wound was then closed in layers using #1 Vicryl followed by 2-0 Vicryl, followed by 4-0 Monocryl.  Benzoin and Steri-Strips were applied followed by sterile dressing.     Of note, Jason Coop was my assistant throughout surgery, and did aid in retraction, suctioning, the decompression, placement of the hardware, and closure.     Estill Bamberg, MD

## 2023-09-19 NOTE — Anesthesia Procedure Notes (Signed)
Procedure Name: Intubation Date/Time: 09/19/2023 11:53 AM  Performed by: Shary Decamp, CRNAPre-anesthesia Checklist: Patient identified, Emergency Drugs available, Suction available, Timeout performed and Patient being monitored Patient Re-evaluated:Patient Re-evaluated prior to induction Oxygen Delivery Method: Circle system utilized Preoxygenation: Pre-oxygenation with 100% oxygen Induction Type: IV induction Ventilation: Mask ventilation without difficulty Laryngoscope Size: Mac and 4 Grade View: Grade I Tube type: Oral Tube size: 7.5 mm Number of attempts: 1 Airway Equipment and Method: Stylet Placement Confirmation: ETT inserted through vocal cords under direct vision, positive ETCO2 and breath sounds checked- equal and bilateral Secured at: 23 cm Tube secured with: Tape Dental Injury: Teeth and Oropharynx as per pre-operative assessment

## 2023-09-19 NOTE — Transfer of Care (Signed)
Immediate Anesthesia Transfer of Care Note  Patient: Gregory Khan  Procedure(s) Performed: LEFT-SIDED LUMBAR 4 - LUMBAR 5 TRANSFORAMINAL LUMBAR INTERBODY FUSION AND DECOMPRESSION WITH INSTRUMENTATION AND ALLOGRAFT (Left)  Patient Location: PACU  Anesthesia Type:General  Level of Consciousness: awake  Airway & Oxygen Therapy: Patient Spontanous Breathing  Post-op Assessment: Report given to RN  Post vital signs: stable  Last Vitals:  Vitals Value Taken Time  BP 125/84 09/19/23 1540  Temp    Pulse 88 09/19/23 1542  Resp 17 09/19/23 1542  SpO2 93 % 09/19/23 1542  Vitals shown include unfiled device data.  Last Pain:  Vitals:   09/19/23 0949  TempSrc:   PainSc: 9       Patients Stated Pain Goal: 1 (09/19/23 0949)  Complications: No notable events documented.

## 2023-09-19 NOTE — Anesthesia Preprocedure Evaluation (Signed)
Anesthesia Evaluation  Patient identified by MRN, date of birth, ID band Patient awake    Reviewed: Allergy & Precautions, NPO status , Patient's Chart, lab work & pertinent test results, reviewed documented beta blocker date and time   History of Anesthesia Complications Negative for: history of anesthetic complications  Airway Mallampati: III  TM Distance: >3 FB Neck ROM: Full    Dental  (+) Dental Advisory Given   Pulmonary sleep apnea and Continuous Positive Airway Pressure Ventilation    breath sounds clear to auscultation       Cardiovascular hypertension, Pt. on medications and Pt. on home beta blockers (-) angina  Rhythm:Regular Rate:Normal  '18 ECHO:  -moderate LVH. Systolic function was normal. EF 60-65%. Wall motion was normal;  no regional wall motion abnormalities.   - Aortic valve: Trileaflet; mildly thickened, mildly calcified leaflets.  - Aorta: Aortic root dimension: 43 mm (ED).  - Ascending aorta: The ascending aorta was mildly dilated.  - Left atrium: The atrium was mildly dilated.  - no significant valvular abnormalities    Neuro/Psych negative neurological ROS     GI/Hepatic negative GI ROS, Neg liver ROS,,,  Endo/Other  diabetes, Oral Hypoglycemic Agents  BMI 35  Renal/GU Renal InsufficiencyRenal disease     Musculoskeletal  (+) Arthritis , Osteoarthritis,    Abdominal  (+) + obese  Peds  Hematology   Anesthesia Other Findings   Reproductive/Obstetrics                             Anesthesia Physical Anesthesia Plan  ASA: 3  Anesthesia Plan: General   Post-op Pain Management: Dilaudid IV   Induction: Intravenous  PONV Risk Score and Plan: 2 and Ondansetron, Midazolam and Treatment may vary due to age or medical condition  Airway Management Planned: Oral ETT  Additional Equipment: None  Intra-op Plan:   Post-operative Plan: Extubation in OR  Informed  Consent: I have reviewed the patients History and Physical, chart, labs and discussed the procedure including the risks, benefits and alternatives for the proposed anesthesia with the patient or authorized representative who has indicated his/her understanding and acceptance.     Dental advisory given  Plan Discussed with: CRNA and Surgeon  Anesthesia Plan Comments: (PAT note by Antionette Poles, PA-C: 58 yo male follows cardiology for history of difficult to control HTN, OSA on CPAP, HLD.  Seen by Jari Favre, PA-C on 05/06/2023 for preop evaluation.  Per note, "Mr. Mcgivern's perioperative risk of a major cardiac event is 0.9% according to the Revised Cardiac Risk Index (RCRI).  Therefore, he is at low risk for perioperative complications.   His functional capacity is fair at 5.07 METs according to the Duke Activity Status Index (DASI). Recommendations: According to ACC/AHA guidelines, no further cardiovascular testing needed.  The patient may proceed to surgery at acceptable risk."  Non-insulin-dependent DM2, A1c 7.0 preop labs.  Preop labs reviewed, creatinine mildly elevated 1.26, hypercalcemia with calcium 11.3, otherwise unremarkable.  EKG 12/21/22: Sinus bradycardia. Rate 58.  TTE 01/29/2017: - Left ventricle: The cavity size was normal. Wall thickness was  increased in a pattern of moderate LVH. Systolic function was  normal. The estimated ejection fraction was in the range of 60%  to 65%. Wall motion was normal; there were no regional wall  motion abnormalities. There was no evidence of elevated  ventricular filling pressure by Doppler parameters.  - Aortic valve: Trileaflet; mildly thickened, mildly calcified  leaflets.  -  Aorta: Aortic root dimension: 43 mm (ED).  - Ascending aorta: The ascending aorta was mildly dilated.  - Left atrium: The atrium was mildly dilated.  - Right ventricle: The cavity size was mildly dilated. Wall  thickness was normal.    )         Anesthesia Quick Evaluation

## 2023-09-20 ENCOUNTER — Other Ambulatory Visit: Payer: Self-pay

## 2023-09-20 ENCOUNTER — Other Ambulatory Visit (HOSPITAL_COMMUNITY): Payer: Self-pay

## 2023-09-20 DIAGNOSIS — M5116 Intervertebral disc disorders with radiculopathy, lumbar region: Secondary | ICD-10-CM | POA: Diagnosis not present

## 2023-09-20 LAB — GLUCOSE, CAPILLARY: Glucose-Capillary: 132 mg/dL — ABNORMAL HIGH (ref 70–99)

## 2023-09-20 MED ORDER — METHOCARBAMOL 500 MG PO TABS
500.0000 mg | ORAL_TABLET | Freq: Four times a day (QID) | ORAL | 2 refills | Status: DC | PRN
  Filled 2023-09-20: qty 30, 4d supply, fill #0

## 2023-09-20 MED ORDER — OXYCODONE-ACETAMINOPHEN 5-325 MG PO TABS
1.0000 | ORAL_TABLET | ORAL | 0 refills | Status: DC | PRN
  Filled 2023-09-20: qty 30, 3d supply, fill #0

## 2023-09-20 MED FILL — Thrombin (Recombinant) For Soln 20000 Unit: CUTANEOUS | Qty: 1 | Status: AC

## 2023-09-20 NOTE — Progress Notes (Signed)
 Patient alert and oriented, voiding adequately, skin clean, dry and intact without evidence of skin break down, or symptoms of complications - no redness or edema noted, only slight tenderness at site.  Patient states pain is manageable at time of discharge. Room was checked and accounted for all patient's belongings; discharge instructions concerning her medications, incision care, follow up appointment and when to call the doctor as needed were all discussed with patient by RN and he expressed understanding on the instructions given.

## 2023-09-20 NOTE — Evaluation (Signed)
Physical Therapy Brief Evaluation and Discharge Note Patient Details Name: Gregory Khan MRN: [id number] DOB: 1966-04-06 Today's Date: 09/20/2023   History of Present Illness  58 yo male admitted 1/30 for L4-5 fusion. PMHx: microdiscectomy L4-5, bil TKA, HLD, HTN, obesity, PVD, DM  Clinical Impression  Pt very pleasant, familiar with precautions from prior sx with education for all. Pt educated for brace wear with min assist to don/doff. Pt performing transfers, gait and stairs without physical assist and educated for progressive gait and precautions related to daily mobility. No further needs at this time with pt aware and agreeable, will sign off.        PT Assessment Patient does not need any further PT services  Assistance Needed at Discharge  PRN    Equipment Recommendations None recommended by PT  Recommendations for Other Services       Precautions/Restrictions Precautions Precautions: Back Precaution Booklet Issued: Yes (comment) Required Braces or Orthoses: Spinal Brace Spinal Brace: Thoracolumbosacral orthotic;Applied in sitting position        Mobility  Bed Mobility Rolling: Modified independent (Device/Increase time) Supine/Sidelying to sit: Modified independent (Device/Increased time) Sit to supine/sidelying: Modified independent (Device/Increased time) General bed mobility comments: pt able to perform rolling, side<>sit without assist with good maintainence of precautions  Transfers Overall transfer level: Modified independent                      Ambulation/Gait Ambulation/Gait assistance: Supervision Gait Distance (Feet): 350 Feet Assistive device: None Gait Pattern/deviations: Step-through pattern, Decreased stride length Gait Speed: Below normal General Gait Details: short steps with antalgic gait slight lateral sway, good posture with education for ambulation and progressive distance  Home Activity Instructions    Stairs Stairs:  Yes Stairs assistance: Modified independent (Device/Increase time) Stair Management: Step to pattern, Forwards, One rail Right Number of Stairs: 11 General stair comments: step to pattern up with right and down with left  Modified Rankin (Stroke Patients Only)        Balance Overall balance assessment: No apparent balance deficits (not formally assessed)                        Pertinent Vitals/Pain   Pain Assessment Pain Assessment: 0-10 Pain Score: 6  Pain Location: incision Pain Descriptors / Indicators: Aching Pain Intervention(s): Limited activity within patient's tolerance, Premedicated before session, Monitored during session, Repositioned     Home Living Family/patient expects to be discharged to:: Private residence Living Arrangements: Spouse/significant other Available Help at Discharge: Family;Available 24 hours/day Home Environment: Stairs in home  [place] of Steps: 14 Home Equipment: Rolling Walker (2 wheels);BSC/3in1;Cane - single point;Shower seat        Prior Function Level of Independence: Independent      UE/LE Assessment   UE ROM/Strength/Tone/Coordination: WFL    LE ROM/Strength/Tone/Coordination: [facility]      Communication   Communication Communication: No apparent difficulties Cueing Techniques: Verbal cues     Cognition Overall Cognitive Status: Appears within functional limits for tasks assessed/performed       General Comments      Exercises     Assessment/Plan    PT Problem List         PT Visit Diagnosis Other abnormalities of gait and mobility (R26.89)    No Skilled PT All education completed;Patient will have necessary level of assist by caregiver at discharge;Patient is modified independent with all activity/mobility   Co-evaluation  AMPAC 6 Clicks Help needed turning from your back to your side while in a flat bed without using bedrails?: None Help needed moving from lying on  your back to sitting on the side of a flat bed without using bedrails?: None Help needed moving to and from a bed to a chair (including a wheelchair)?: None Help needed standing up from a chair using your arms (e.g., wheelchair or bedside chair)?: None Help needed to walk in hospital room?: None Help needed climbing 3-5 steps with a railing? : None 6 Click Score: 24      End of Session Equipment Utilized During Treatment: Back brace Activity Tolerance: Patient tolerated treatment well Patient left: in bed Nurse Communication: Mobility status;Precautions PT Visit Diagnosis: Other abnormalities of gait and mobility (R26.89)     Time: 1610-9604 PT Time Calculation (min) (ACUTE ONLY): 20 min  Charges:   PT Evaluation $PT Eval Low Complexity: 1 Low      Noam Franzen P, PT Acute Rehabilitation Services Office: [phone]   Cristine Polio  09/20/2023, 8:42 AM

## 2023-09-20 NOTE — Anesthesia Postprocedure Evaluation (Signed)
Anesthesia Post Note  Patient: Gregory Khan  Procedure(s) Performed: LEFT-SIDED LUMBAR 4 - LUMBAR 5 TRANSFORAMINAL LUMBAR INTERBODY FUSION AND DECOMPRESSION WITH INSTRUMENTATION AND ALLOGRAFT (Left)     Patient location during evaluation: PACU Anesthesia Type: General Level of consciousness: awake and alert Pain management: pain level controlled Vital Signs Assessment: post-procedure vital signs reviewed and stable Respiratory status: spontaneous breathing, nonlabored ventilation and respiratory function stable Cardiovascular status: blood pressure returned to baseline and stable Postop Assessment: no apparent nausea or vomiting Anesthetic complications: no   No notable events documented.  Last Vitals:  Vitals:   09/20/23 0615 09/20/23 0713  BP: (!) 128/97 (!) 142/86  Pulse: 72 87  Resp: 20 20  Temp: 36.7 C 36.9 C  SpO2: 96% 97%    Last Pain:  Vitals:   09/20/23 0910  TempSrc:   PainSc: 8    Pain Goal: Patients Stated Pain Goal: 2 (09/20/23 0910)                 Lowella Curb

## 2023-09-20 NOTE — Evaluation (Signed)
Occupational Therapy Evaluation Patient Details Name: Gregory Khan MRN: [id number] DOB: 12-26-1965 Today's Date: 09/20/2023   History of Present Illness 58 yo male admitted 1/30 for L4-5 fusion. PMHx: microdiscectomy L4-5, bil TKA, HLD, HTN, obesity, PVD, DM   Clinical Impression   Pt has been assisted for LB dressing as needed and ambulates independently at baseline. He lives with his supportive wife who also works at home. Pt presents with 8/10 back pain. He has been routinely walking mod I in his room and is knowledgeable in log roll technique for bed mobility. Pt educated in back precautions and compensatory strategies for ADLs, including AE use for LB bathing and dressing. Pt verbalized understanding. No further OT needs.       If plan is discharge home, recommend the following: A little help with bathing/dressing/bathroom;Assistance with cooking/housework;Assist for transportation    Functional Status Assessment  Patient has had a recent decline in their functional status and demonstrates the ability to make significant improvements in function in a reasonable and predictable amount of time.  Equipment Recommendations  None recommended by OT    Recommendations for Other Services       Precautions / Restrictions Precautions Precautions: Back Precaution Booklet Issued: Yes (comment) Precaution Comments: educated in back precautions related to ADLs and IADLs Required Braces or Orthoses: Spinal Brace Spinal Brace: Thoracolumbosacral orthotic;Applied in sitting position Restrictions Weight Bearing Restrictions Per Provider Order: No      Mobility Bed Mobility Overal bed mobility: Modified Independent             General bed mobility comments: pt aware of log roll technique    Transfers Overall transfer level: Modified independent                        Balance                                           ADL either performed or  assessed with clinical judgement   ADL                                         General ADL Comments: Educated pt in compensatory strategies for LB bathing and dressing using long handled bath sponge, reacher and sock aid. Pt not quite able to perform figure 4 with L LE s/p TKA. Instructed in 2 cup method for oral care, washing face with washcloth, avoiding twisting with pericare and IADLs to avoid.     Vision Ability to See in Adequate Light: 0 Adequate Patient Visual Report: No change from baseline       Perception         Praxis         Pertinent Vitals/Pain Pain Assessment Pain Assessment: 0-10 Pain Score: 8  Pain Location: incision Pain Descriptors / Indicators: Aching Pain Intervention(s): Monitored during session, Patient requesting pain meds-RN notified, Repositioned     Extremity/Trunk Assessment Upper Extremity Assessment Upper Extremity Assessment: Overall WFL for tasks assessed   Lower Extremity Assessment Lower Extremity Assessment: Defer to PT evaluation   Cervical / Trunk Assessment Cervical / Trunk Assessment: Back Surgery   Communication Communication Communication: No apparent difficulties Cueing Techniques: Verbal cues   Cognition Arousal: Alert Behavior During Therapy: [facility] for tasks assessed/performed  Overall Cognitive Status: Within Functional Limits for tasks assessed                                       General Comments       Exercises     Shoulder Instructions      Home Living Family/patient expects to be discharged to:: Private residence Living Arrangements: Spouse/significant other Available Help at Discharge: Family;Available 24 hours/day (wife and pt work from home) Type of Home: House             Bathroom Shower/Tub: Arts development officer Toilet: Handicapped height     Home Equipment: Shower seat - built in;Hand held shower head;Adaptive equipment Adaptive Equipment: Reacher         Prior Functioning/Environment Prior Level of Function : Independent/Modified Independent;Working/employed;Driving                        OT Problem List:        OT Treatment/Interventions:      OT Goals(Current goals can be found in the care plan section)    OT Frequency:      Co-evaluation              AM-PAC OT "6 Clicks" Daily Activity     Outcome Measure Help from another person eating meals?: None Help from another person taking care of personal grooming?: None Help from another person toileting, which includes using toliet, bedpan, or urinal?: None Help from another person bathing (including washing, rinsing, drying)?: A Little Help from another person to put on and taking off regular upper body clothing?: None Help from another person to put on and taking off regular lower body clothing?: A Little 6 Click Score: 22   End of Session Nurse Communication: Patient requests pain meds  Activity Tolerance: Patient tolerated treatment well Patient left: in bed;with call bell/phone within reach  OT Visit Diagnosis: Pain                Time: 1610-9604 OT Time Calculation (min): 18 min Charges:  OT General Charges $OT Visit: 1 Visit OT Evaluation $OT Eval Low Complexity: 1 Low Gregory Khan, OTR/L Acute Rehabilitation Services Office: [phone]  Gregory Khan 09/20/2023, 9:58 AM

## 2023-09-20 NOTE — TOC Benefit Eligibility Note (Signed)
Pharmacy Patient Advocate Encounter  Insurance verification completed.    The patient is insured through [employer] [facility]. Patient has [employer], may use a copay card, and/or apply for patient assistance if available.    Ran test claim for Oxycodone/apap 5/325mg  and the current 7 day co-pay is $2.06.  Approval id: [id number]   This test claim was processed through [facility]- copay amounts may vary at other pharmacies due to pharmacy/plan contracts, or as the patient moves through the different stages of their insurance plan.

## 2023-09-20 NOTE — Progress Notes (Signed)
    Patient doing well  Leg pain is resolved   Physical Exam: Vitals:   09/20/23 0615 09/20/23 0713  BP: (!) 128/97 (!) 142/86  Pulse: 72 87  Resp: 20 20  Temp: 98.1 F (36.7 C) 98.5 F (36.9 C)  SpO2: 96% 97%    Dressing in place NVI  POD #1 s/p revision decompression and fusion, doing excellent  - up with PT/OT, encourage ambulation - Percocet for pain, Robaxin for muscle spasms - d/c home today with f/u in 2 weeks

## 2023-09-30 ENCOUNTER — Encounter (HOSPITAL_COMMUNITY): Payer: Self-pay | Admitting: Orthopedic Surgery

## 2023-10-01 ENCOUNTER — Ambulatory Visit: Payer: 59 | Admitting: Cardiovascular Disease

## 2023-10-03 NOTE — Discharge Summary (Signed)
Patient ID: Gregory Khan MRN: [id number] DOB/AGE: 1966-03-30 58 y.o.  Admit date: 09/19/2023 Discharge date: 09/20/2023  Admission Diagnoses:  Principal Problem:   Radiculopathy, lumbar region   Discharge Diagnoses:  Same  Past Medical History:  Diagnosis Date   Arthritis    Hx. Bilateral Knee Replacement   Diabetes mellitus without complication (HCC)    Type 2   Hyperlipidemia    Hypertension    Obesity    Peripheral vascular disease (HCC)    Thoracic Aneurysm    Surgeries: Procedure(s): LEFT-SIDED LUMBAR 4 - LUMBAR 5 TRANSFORAMINAL LUMBAR INTERBODY FUSION AND DECOMPRESSION WITH INSTRUMENTATION AND ALLOGRAFT on 09/19/2023   Consultants: None  Discharged Condition: Improved  Hospital Course: Gregory Khan is an 58 y.o. male who was admitted 09/19/2023 for operative treatment of Radiculopathy, lumbar region. Patient has severe unremitting pain that affects sleep, daily activities, and work/hobbies. After pre-op clearance the patient was taken to the operating room on 09/19/2023 and underwent  Procedure(s): LEFT-SIDED LUMBAR 4 - LUMBAR 5 TRANSFORAMINAL LUMBAR INTERBODY FUSION AND DECOMPRESSION WITH INSTRUMENTATION AND ALLOGRAFT.    Patient was given perioperative antibiotics:  Anti-infectives (From admission, onward)    Start     Dose/Rate Route Frequency Ordered Stop   09/19/23 1800  ceFAZolin (ANCEF) IVPB 2g/100 mL premix        2 g 200 mL/hr over 30 Minutes Intravenous Every 8 hours 09/19/23 1656 09/20/23 0850   09/19/23 1000  ceFAZolin (ANCEF) IVPB 2g/100 mL premix        2 g 200 mL/hr over 30 Minutes Intravenous On call to O.R. 09/19/23 0958 09/19/23 1230   09/19/23 1000  ceFAZolin (ANCEF) 2-4 GM/100ML-% IVPB       Note to Pharmacy: Amanda Pea M: cabinet override      09/19/23 1000 09/19/23 1209        Patient was given sequential compression devices, early ambulation to prevent DVT.  Patient benefited maximally from hospital stay and there were no  complications.    Recent vital signs: BP (!) 142/86 (BP Location: Left Arm)   Pulse 87   Temp 98.5 F (36.9 C) (Oral)   Resp 20   Ht 6\' 2"  (1.88 m)   Wt 116.6 kg   SpO2 97%   BMI 33.00 kg/m    Discharge Medications:   Allergies as of 09/20/2023   No Known Allergies      Medication List     STOP taking these medications    acetaminophen 500 MG tablet Commonly known as: TYLENOL       TAKE these medications    amLODipine 10 MG tablet Commonly known as: NORVASC Take 1 tablet (10 mg total) by mouth daily.   atorvastatin 40 MG tablet Commonly known as: LIPITOR Take 1 tablet (40 mg total) by mouth daily.   gabapentin 300 MG capsule Commonly known as: NEURONTIN Take 300 mg by mouth 3 (three) times daily.   glyBURIDE 2.5 MG tablet Commonly known as: DIABETA Take 1 tablet (2.5 mg total) by mouth 2 (two) times daily with a meal. What changed: when to take this   lisinopril 40 MG tablet Commonly known as: ZESTRIL TAKE 1 TABLET BY MOUTH EVERY DAY   metFORMIN 500 MG 24 hr tablet Commonly known as: GLUCOPHAGE-XR Take 1,000 mg by mouth 2 (two) times daily.   methocarbamol 500 MG tablet Commonly known as: ROBAXIN Take 1-2 tablets (500-1,000 mg total) by mouth every 6 (six) hours as needed for muscle spasms.  metoprolol succinate 100 MG 24 hr tablet Commonly known as: TOPROL-XL TAKE 1 TABLET BY MOUTH DAILY. TAKE WITH OR IMMEDIATELY FOLLOWING A MEAL.   oxyCODONE-acetaminophen 5-325 MG tablet Commonly known as: PERCOCET/ROXICET Take 1 tablet by mouth every 6 (six) hours as needed (pain.). What changed: Another medication with the same name was added. Make sure you understand how and when to take each.   oxyCODONE-acetaminophen 5-325 MG tablet Commonly known as: PERCOCET/ROXICET Take 1-2 tablets by mouth every 4 (four) hours as needed for severe pain (pain score 7-10). What changed: You were already taking a medication with the same name, and this prescription was  added. Make sure you understand how and when to take each.   spironolactone 25 MG tablet Commonly known as: ALDACTONE TAKE 1 TABLET (25 MG TOTAL) BY MOUTH DAILY.        Diagnostic Studies: DG Lumbar Spine 2-3 Views Result Date: 09/19/2023 CLINICAL DATA:  Elective surgery. EXAM: LUMBAR SPINE - 2-3 VIEW COMPARISON:  Preoperative MRI. FINDINGS: Two fluoroscopic spot views of the lumbar spine in the operating room. Posterior rod with intrapedicular screw fusion and interbody spacer at L4-L5. Fluoroscopy time 41.4 seconds. Dose 37.8 mGy. IMPRESSION: Intraoperative fluoroscopy during L4-L5 fusion. Electronically Signed   By: Narda Rutherford M.D.   On: 09/19/2023 16:30   DG Lumbar Spine 1 View Result Date: 09/19/2023 CLINICAL DATA:  Elective surgery. EXAM: LUMBAR SPINE - 1 VIEW COMPARISON:  Lumbar MRI 07/17/2023 FINDINGS: Portable cross-table lateral view of the lumbar spine obtained in the operating room. Lower most disc space is labeled L5-S1. Surgical instruments localize posteriorly at the L5 and S1 levels. IMPRESSION: Surgical instruments localize posteriorly at the L5 and S1 levels. Electronically Signed   By: Narda Rutherford M.D.   On: 09/19/2023 16:29   DG C-Arm 1-60 Min-No Report Result Date: 09/19/2023 Fluoroscopy was utilized by the requesting physician.  No radiographic interpretation.   DG C-Arm 1-60 Min-No Report Result Date: 09/19/2023 Fluoroscopy was utilized by the requesting physician.  No radiographic interpretation.   DG C-Arm 1-60 Min-No Report Result Date: 09/19/2023 Fluoroscopy was utilized by the requesting physician.  No radiographic interpretation.    Disposition: Discharge disposition: 01-Home or Self Care        POD #1 s/p revision decompression and fusion, doing excellent   - up with PT/OT, encourage ambulation - Percocet for pain, Robaxin for muscle spasms -Scripts for pain sent to pharmacy electronically  -D/C instructions sheet printed and in  chart -D/C today  -F/U in office 2 weeks   Signed: Eilene Ghazi Nalda Shackleford 10/03/2023, 10:44 AM

## 2023-11-17 ENCOUNTER — Other Ambulatory Visit: Payer: Self-pay | Admitting: Cardiovascular Disease

## 2023-12-10 ENCOUNTER — Other Ambulatory Visit: Payer: Self-pay | Admitting: Cardiovascular Disease

## 2023-12-11 ENCOUNTER — Ambulatory Visit: Payer: 59 | Attending: Cardiovascular Disease | Admitting: Cardiovascular Disease

## 2023-12-11 VITALS — BP 146/90 | HR 66 | Ht 74.0 in | Wt 266.6 lb

## 2023-12-11 DIAGNOSIS — N183 Chronic kidney disease, stage 3 unspecified: Secondary | ICD-10-CM | POA: Diagnosis not present

## 2023-12-11 DIAGNOSIS — Z01818 Encounter for other preprocedural examination: Secondary | ICD-10-CM

## 2023-12-11 DIAGNOSIS — E785 Hyperlipidemia, unspecified: Secondary | ICD-10-CM

## 2023-12-11 DIAGNOSIS — G4733 Obstructive sleep apnea (adult) (pediatric): Secondary | ICD-10-CM

## 2023-12-11 DIAGNOSIS — E119 Type 2 diabetes mellitus without complications: Secondary | ICD-10-CM

## 2023-12-11 DIAGNOSIS — M5126 Other intervertebral disc displacement, lumbar region: Secondary | ICD-10-CM

## 2023-12-11 DIAGNOSIS — Z79899 Other long term (current) drug therapy: Secondary | ICD-10-CM

## 2023-12-11 DIAGNOSIS — I10 Essential (primary) hypertension: Secondary | ICD-10-CM | POA: Diagnosis not present

## 2023-12-11 DIAGNOSIS — E66811 Obesity, class 1: Secondary | ICD-10-CM

## 2023-12-11 DIAGNOSIS — E66812 Obesity, class 2: Secondary | ICD-10-CM

## 2023-12-11 MED ORDER — METOPROLOL SUCCINATE ER 100 MG PO TB24: 100.0000 mg | ORAL_TABLET | Freq: Every day | ORAL | 0 refills | Status: DC

## 2023-12-11 MED ORDER — SPIRONOLACTONE 25 MG PO TABS: 25.0000 mg | ORAL_TABLET | Freq: Every day | ORAL | 0 refills | Status: DC

## 2023-12-11 MED ORDER — LISINOPRIL 40 MG PO TABS: 40.0000 mg | ORAL_TABLET | Freq: Every day | ORAL | 0 refills | Status: DC

## 2023-12-11 MED ORDER — ATORVASTATIN CALCIUM 40 MG PO TABS: 40.0000 mg | ORAL_TABLET | Freq: Every day | ORAL | 3 refills | Status: AC

## 2023-12-11 MED ORDER — AMLODIPINE BESYLATE 10 MG PO TABS: 10.0000 mg | ORAL_TABLET | Freq: Every day | ORAL | 3 refills | Status: AC

## 2023-12-11 NOTE — Patient Instructions (Signed)
 Medication Instructions:  NO CHANGES *If you need a refill on your cardiac medications before your next appointment, please call your pharmacy*  Lab Work: NO LABS If you have labs (blood work) drawn today and your tests are completely normal, you will receive your results only by: MyChart Message (if you have MyChart) OR A paper copy in the mail If you have any lab test that is abnormal or we need to change your treatment, we will call you to review the results.  Testing/Procedures: NO TESTING  Follow-Up: At [facility], you and your health needs are our priority.  As part of our continuing mission to provide you with exceptional heart care, our providers are all part of one team.  This team includes your primary Cardiologist (physician) and Advanced Practice Providers or APPs (Physician Assistants and Nurse Practitioners) who all work together to provide you with the care you need, when you need it.  Your next appointment:   9 month(s)  Provider:   Luana Rumple, MD  Other Instructions   1st Floor: - Lobby - Registration  - Pharmacy  - Lab - Cafe  2nd Floor: - PV Lab - Diagnostic Testing (echo, CT, nuclear med)  3rd Floor: - Vacant  4th Floor: - TCTS (cardiothoracic surgery) - AFib Clinic - Structural Heart Clinic - Vascular Surgery  - Vascular Ultrasound  5th Floor: - HeartCare Cardiology (general and EP) - Clinical Pharmacy for coumadin, hypertension, lipid, weight-loss medications, and med management appointments    Valet parking services will be available as well.

## 2023-12-12 ENCOUNTER — Encounter: Payer: Self-pay | Admitting: Cardiovascular Disease

## 2023-12-12 NOTE — Progress Notes (Signed)
 Cardiology Office Note    Date:  12/12/2023   ID:  Gregory Khan 05/02/1966, MRN [id number]  PCP:  Otilia Bloch, MD  Cardiologist:  Magnus Schuller, MD   11 month F/U  office visit   History of Present Illness:  Gregory Khan is a 58 y.o. male who is the Production designer, theatre/television/film for global supply chain for [employer].  He was initially referred by Dr. Keever at [facility] for cardiology consultation and further evaluation of hypertension.  I saw him for preoperative evaluation prior to knee surgery by Dr. Jinger Mount. I last saw him in May 2024.  He presents for follow-up cardiology/sleep evaluation.  Gregory Khan admits to a >15 year history of hypertension as well as a three-year history of type 2 diabetes mellitus.  He also has a history of hyperlipidemia.  He has been followed by Dr. Drinda Gentry and recently Dr. Keever at [facility].  Remotely, he has been on Benicar HCT, amlodipine  and metoprolol  for hypertension.  Recently, his blood pressures have consistently been elevated and Benicar was discontinued and he is now on lisinopril /HCT 40/25 mg daily in addition to metoprolol  50 mg twice a day and amlodipine  10 mg daily.  Review of the records from [facility] indicate that his blood pressures have been in the 140s to 150s with diastolics in the low 90s.  Laboratory in 2017 had shown a glucose of 144.  BUN 20, Cr 1.09.  Normal LFTs.  He has been on atorvastatin  20 mg and cholesterol was 161, triglycerides 77, HDL 62, LDL 84.  TSH was normal at 1.4.  Hemoglobin 15 and hematocrit 45.1.  Hemoglobin A1c in January 2018 at improved to 5.7.  Chest x-ray suggested my mild cardiac enlargement without mediastinal widening and was without active cardiac pulmonary disease.  Earlier this year, there was a blood pressure reading in January of 162/105.    When I initially saw him, his blood pressure remained elevated.  With his long-standing hypertension I added  spironolactone  for aldosterone blockade initially at 12.5 mg.   He has  felt improved on this.  I also scheduled him for an echo Doppler study which was done on 01/29/2017.  This showed moderate LVH.  He had normal systolic function.  Diastolic parameters were normal.  There was mild aortic root dilation.  His left atrium was mildly dilated.  He underwent subsequent laboratory.  Hemoglobin and hematocrit were stable.  He has mild renal insufficiency.  His most recent creatinine was 1.42.  ALT was minimally increased at 54 with a normal AST at 24.  He has felt improved.  When I last saw him, he had just returned from [place] and worked both in [place] in [place].  At that office visit, I recommended further titration of spironolactone  to 25 mg daily since his blood pressure was improved, but remained elevated.  Recently, he had also been taking Voltaren.  He underwent follow-up labwork last week, which now showed a BUN that had risen by his report and 37 and creatinine at 1.50.  He was called by Dr. Irvin Mantel office.  Voltaren was discontinued.  Repeat chemistry on August 10 showed a BUN had improved to 23 and his creatinine remained at 1.5.  LFTs were normal.  He feels the spironolactone  has controlled his blood pressure.  He could sense greater diuretic effect.  He denied any chest pain or palpitations.  He was given clearance to undergo knee surgery.  He did have transient increasing  creatinine which improved with discontinuance of diclofenac.  He also was switched to lisinopril  alone instead of combination with HCTZ.  He underwent knee surgery at the outpatient surgical Center on 04/17/2017 without cardiovascular compromise.  When I last saw him, pressure was trending upward upon his return to work from his surgery.  Is eating more and had gained over 5 pounds.  I recommended resumption of HCTZ 12.5 mg every other day.   When I  saw him in May 2019 he was doing well and denied any knee discomfort and was more active following his surgery.  During that evaluation he informed me  that his wife stated that he was snoring with significant intensity.  He was typically going to bed at 10:30 PM and waking up at 6:30 AM.  An Epworth Sleepiness Scale score endorsed at 8.    I referred him for a sleep study which was done on Jan 16, 2018.  This was notable for poor sleep efficiency and absence of supine sleep.  Although his overall AHI was 3.2, his RDI was moderately increased at 15.6.  He also had moderate sleep apnea during REM sleep with an AHI of 24.8 and oxygen desaturation to a nadir of 83%.  He was referred for CPAP titration study and required titration up to 14 cm water pressure.  Of note he also had frequent periodic limb movements of sleep.  He subsequently was set up with CPAP therapy on March 18, 2018 with a [employer] as his DME company.  I saw him for initial CPAP follow-up in September 2019.  At that time his blood pressure was stable on amlodipine  10 mg, lisinopril  40 mg, metoprolol  50 mg twice a day, and spironolactone  25 mg daily.  With CPAP initiation he noted significantly more energy.  A download was obtained from August 24 through May 11, 2018.  He is meeting compliance standards with 77% of usage days.  Usage to than 4 hours was compliant but he was only averaging 4 hours and 48 minutes of CPAP use per night at his 14 cm pressure.  AHI was excellent at 0.7.  He notes improvement in prior nocturia.  He is unaware of any snoring.  He denies residual daytime sleepiness.  An Epworth Sleepiness Scale score was calculated in the office today and this endorsed at 5.    Since I last saw him, he has been evaluated in February 2021 by Ervin Heath, PA and more recently in March 2022 by Tacy Expose, PA-C.  I had not seen him in over 3 years and saw him in June 2022.  Over the last 3 years, he admits to weight gain with the COVID pandemic of close to 20 pounds.  He was working remotely but since April has been back in the office.  He had laboratory in January 2022 which showed  hemoglobin A1c of 6.9, he was not anemic with a hemoglobin of 15.1 hematocrit 44.5.  TSH was 1.8.  Cholesterol was 94 with an HDL of 44.  Glucose was 150.  LFTs were normal.  Presently he denies any chest pain.  He is trying to increase his activity.  He has continued to use CPAP but compliance has been very poor.  I obtained a download from May 8 through January 23, 2021.  Usage days was only 23% and average use was only 1 hour and 44 minutes.  AHI was 6.3.  Typically he has been going to bed at 11 PM and waking  up at 7 AM.  During that evaluation, his blood pressure was stable on his regimen of amlodipine  10 mg, lisinopril  40 mg, metoprolol  tartrate 50 mg twice a day in addition to spironolactone  25 mg daily.  He continues to be on glyburide  2.5 mg for diabetes and had recently been started on med Forman XR 500 mg daily.  He was on atorvastatin  20 mg and with his LDL of 94 in January 2020 I recommended further titration to 40 mg.  I had a lengthy discussion with him regarding optimal sleep duration at 7 to 8 hours with CPAP therapy.  I saw him on May 24, 2021.  He had been doing extensive travel for work in [place], [place], [place], and [place].  He now has a new ResMed F 30i mask which is significantly improved from his prior mask.  I obtained a new download from September 4 through May 22, 2021.  He is compliant with CPAP usage  at 83% of days and usage on days used was 5 hours and 44 minutes.  His pressure is set at a range of 13 to 20 cm of water and AHI is 1.0 with 95th percentile pressure average at 13.7 and maximum average pressure 13.  He denied chest pain PND orthopnea.  He has been trying to lose some weight.  I discussed with him the importance of trying to exercise at least 5 days/week for at least 30 minutes per [facility] recommendations.  I saw him on May 22, 2022.  At that time, he continued to be on amlodipine  10 mg, lisinopril  40 mg, Toprol  tartrate 50 mg twice a day and spironolactone  25 mg  daily.  He was also on glimepiride and metformin  for diabetes mellitus and atorvastatin  40 mg daily for hyperlipidemia.  LDL cholesterol September 2022 was 75 and when rechecked in April 2023 LDL was 80.  During that evaluation, I obtained a download on his CPAP and he was not very compliant with reference to usage.  His dog often times is waking up at night barking and he would go to a different room and not use CPAP.  His blood pressure was elevated and elected to add low-dose hydralazine  initially at 25 mg twice a day to his blood pressure regimen.    I saw him on August 08, 2022.  At that time his systolic blood pressure was running around 125-130 but his diastolic blood pressure is consistently been elevated in the upper 80s to around 90.  He had developed a headache following initiation of hydralazine  and this was discontinued.  During that evaluation I recommended he continue amlodipine  10 mg and lisinopril  40 mg but changed his metoprolol  to tartrate from 50 mg twice a day to 100 mg of metoprolol  succinate daily I recommended he continue spironolactone  25 mg.  I obtained a new download in the office which showed usage compliance on days but usage greater than 4 hours was suboptimal at only 15 days with average use of 4 hours and 20 minutes.  I discussed the importance of optimal CPAP use of 7 to 9 hours/day.  He continues to be on atorvastatin  40 mg and had plans for follow-up laboratory at his company in April 2024.  I last saw him in May 2024 at which time his blood pressure was improved on amlodipine  10 mg, lisinopril  40 mg, metoprolol  succinate 100 mg in addition to spironolactone .  He was on glimepiride in addition to metformin  for his diabetes mellitus and was using CPAP  with excellent compliance with  pressure  set at 13 to 20 cm and AHI 0.6.  He was not feeling he was getting enough air at the onset of CPAP therapy and as result I turned off his ramp start pressure.  Since I last saw him, he  underwent microdiscectomy Dr. Jackee Marus at L4 and 5 on May 15, 2023.  On September 19, 2023 he reherniated his disc and underwent L4-L5 fusion surgery.  Subsequently, he has noticed marked improvement in his prior back pain.  He had developed a significant left foot drop which is still present but has significantly improved.  Denies any chest pain or shortness of breath.  He states his blood pressure at home runs around 120/80-85.  He continues to be on amlodipine  10 mg, lisinopril  40 mg, metoprolol  100 mg and spironolactone  25 mg.  He is on glimepiride and metformin  for diabetes mellitus.  He takes atorvastatin  40 mg for hyperlipidemia.  He presents for evaluation.   Past Medical History:  Diagnosis Date   Arthritis    Hx. Bilateral Knee Replacement   Diabetes mellitus without complication (HCC)    Type 2   Hyperlipidemia    Hypertension    Obesity    Peripheral vascular disease (HCC)    Thoracic Aneurysm    Past Surgical History:  Procedure Laterality Date   COLONOSCOPY  2017   KNEE ARTHROPLASTY Bilateral 2018   Seperate surgeries   LUMBAR LAMINECTOMY/DECOMPRESSION MICRODISCECTOMY Left 05/15/2023   Procedure: LEFT-SIDED LUMBAR FOUR - LUMBAR FIVE MICRODISECTOMY;  Surgeon: Virl Grimes, MD;  Location: [facility] OR;  Service: Orthopedics;  Laterality: Left;   TRANSFORAMINAL LUMBAR INTERBODY FUSION (TLIF) WITH PEDICLE SCREW FIXATION 1 LEVEL Left 09/19/2023   Procedure: LEFT-SIDED LUMBAR 4 - LUMBAR 5 TRANSFORAMINAL LUMBAR INTERBODY FUSION AND DECOMPRESSION WITH INSTRUMENTATION AND ALLOGRAFT;  Surgeon: Virl Grimes, MD;  Location: [facility] OR;  Service: Orthopedics;  Laterality: Left;   WISDOM TOOTH EXTRACTION      Current Medications: Outpatient Medications Prior to Visit  Medication Sig Dispense Refill   glyBURIDE  (DIABETA ) 2.5 MG tablet Take 1 tablet (2.5 mg total) by mouth 2 (two) times daily with a meal. (Patient taking differently: Take 2.5 mg by mouth in the morning and at bedtime.) 60  tablet 0   metFORMIN  (GLUCOPHAGE -XR) 500 MG 24 hr tablet Take 1,000 mg by mouth 2 (two) times daily.     amLODipine  (NORVASC ) 10 MG tablet Take 1 tablet (10 mg total) by mouth daily. 90 tablet 3   atorvastatin  (LIPITOR) 40 MG tablet Take 1 tablet (40 mg total) by mouth daily. 90 tablet 3   lisinopril  (ZESTRIL ) 40 MG tablet TAKE 1 TABLET BY MOUTH EVERY DAY 90 tablet 0   metoprolol  succinate (TOPROL -XL) 100 MG 24 hr tablet Take 1 tablet (100 mg total) by mouth daily. TAKE WITH OR IMMEDIATELY FOLLOWING A MEAL.Aaron Aas Please keep scheduled appointment for future refills. Thank you. 30 tablet 0   spironolactone  (ALDACTONE ) 25 MG tablet TAKE 1 TABLET (25 MG TOTAL) BY MOUTH DAILY. 90 tablet 0   gabapentin  (NEURONTIN ) 300 MG capsule Take 300 mg by mouth 3 (three) times daily.     methocarbamol  (ROBAXIN ) 500 MG tablet Take 1-2 tablets (500-1,000 mg total) by mouth every 6 (six) hours as needed for muscle spasms. 30 tablet 2   oxyCODONE -acetaminophen  (PERCOCET/ROXICET) 5-325 MG tablet Take 1 tablet by mouth every 6 (six) hours as needed (pain.).     oxyCODONE -acetaminophen  (PERCOCET/ROXICET) 5-325 MG tablet Take 1-2 tablets by mouth every 4 (  four) hours as needed for severe pain (pain score 7-10). 30 tablet 0   No facility-administered medications prior to visit.     Allergies:   Patient has no known allergies.   Social History   Socioeconomic History   Marital status: Married    Spouse name: Not on file   Number of children: Not on file   Years of education: Not on file   Highest education level: Not on file  Occupational History   Not on file  Tobacco Use   Smoking status: Never   Smokeless tobacco: Never  Vaping Use   Vaping status: Never Used  Substance and Sexual Activity   Alcohol use: Yes    Comment: Weekends socially   Drug use: No   Sexual activity: Yes  Other Topics Concern   Not on file  Social History Narrative   Not on file   Social Drivers of Health   Financial Resource  Strain: Not on file  Food Insecurity: Not on file  Transportation Needs: Not on file  Physical Activity: Not on file  Stress: Not on file  Social Connections: Not on file    Additional social history is notable in that 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  He does travel with work.  He is married for 24 years.  He has 2 children.  His undergraduate college was at [employer].  He does drink occasional wine and rum.  His exercise has been limited since his knee replacement, but previously had biked.  Family History:  The patient's family history includes Hypertension in his father.  His mother is currently living at age 70.  His father died at 33 but was a smoker and had previously undergone CABG revascularization surgery.  He has 2 brothers, ages 46 and 22 who are alive and well.  He has 2 children, ages 70 and 69.  ROS General: Negative; No fevers, chills, or night sweats; positive for weight gain HEENT: Negative; No changes in vision or hearing, sinus congestion, difficulty swallowing Pulmonary: Negative; No cough, wheezing, shortness of breath, hemoptysis Cardiovascular: See history of present illness GI: Negative; No nausea, vomiting, diarrhea, or abdominal pain GU: Negative; No dysuria, hematuria, or difficulty voiding Musculoskeletal: Status post right knee replacement in January 2018. Status post recent left knee surgery 04/17/2017 Hematologic/Oncology: Negative; no easy bruising, bleeding Endocrine: Negative; no heat/cold intolerance; no diabetes Neuro: Negative; no changes in balance, headaches Skin: Negative; No rashes or skin lesions Psychiatric: Negative; No behavioral problems, depression Sleep: Positive for OSA, CPAP set up March 18, 2018.  Initial DME company [employer], now [employer] Other comprehensive 14 point system review is negative.   PHYSICAL EXAM:   VS:  BP (!) 146/90   Pulse 66   Ht 6\' 2"  (1.88 m)   Wt 266 lb 9.6 oz  (120.9 kg)   SpO2 96%   BMI 34.23 kg/m     Repeat blood pressure by me was 128/82  Wt Readings from Last 3 Encounters:  12/11/23 266 lb 9.6 oz (120.9 kg)  09/19/23 257 lb (116.6 kg)  09/17/23 257 lb (116.6 kg)    Weight at office visit with me in 2019 was 276.   General: Alert, oriented, no distress.  Skin: normal turgor, no rashes, warm and dry HEENT: Normocephalic, atraumatic. Pupils equal round and reactive to light; sclera anicteric; extraocular muscles intact;  Nose without nasal septal hypertrophy Mouth/Parynx benign; Mallinpatti scale 3 Neck: No JVD,  no carotid bruits; normal carotid upstroke Lungs: clear to ausculatation and percussion; no wheezing or rales Chest wall: without tenderness to palpitation Heart: PMI not displaced, RRR, s1 s2 normal, 1/6 systolic murmur, no diastolic murmur, no rubs, gallops, thrills, or heaves Abdomen: soft, nontender; no hepatosplenomehaly, BS+; abdominal aorta nontender and not dilated by palpation. Back: no CVA tenderness Pulses 2+ Musculoskeletal: full range of motion, normal strength, no joint deformities Extremities: Foot in brace secondary to foot drop; no clubbing cyanosis or edema, Homan's sign negative  Neurologic: grossly nonfocal; Cranial nerves grossly wnl Psychologic: Normal mood and affect    Studies/Labs Reviewed:   EKG Interpretation Date/Time:  Wednesday December 11 2023 12:11:10 EDT Ventricular Rate:  66 PR Interval:  204 QRS Duration:  84 QT Interval:  374 QTC Calculation: 392 R Axis:   19  Text Interpretation: Normal sinus rhythm Normal ECG No previous ECGs available Confirmed by Magnus Schuller ([id number]) on 12/12/2023 5:17:57 PM    Dec 21, 2022 ECG (independently read by me): Sinus bradycardia at 58  August 08, 2022 ECG (independently read by me): NSR at 76, baseline artifact  May 22, 2022 ECG (independently read by me): NSR at 63, 1st degree AV block; PR 210 msec  May 24, 2021 ECG (independently read by  me): NSR at 70, no ectopy  May 2019 ECG (independently read by me): Normal sinus rhythm at 60 bpm.  PR interval 200 ms.  No ST segment changes.  October 2018 EKG:  EKG is ordered today.  Normal sinus rhythm at 72 bpm.  No ectopy.  Normal intervals.  Q wave in 3.  August 2018 ECG (independently read by me): Normal sinus rhythm at 67 bpm.  Borderline first-degree AV block with a PR interval of 206 ms.  TC interval normal at 388 ms.  No ST segment changes.  Small Q wave in lead 3, nondiagnostic  July 2018 ECG (independently read by me): Normal sinus rhythm at 74 bpm.  Normal intervals.  No ST segment changes.  No ectopy.  May 2018 ECG (independently read by me): Normal sinus rhythm at 66 bpm.  Borderline first degree AV block with a PR interval at 204 ms.  No ST segment changes.  Nondiagnostic Q wave in lead 3.  Recent Labs:    Latest Ref Rng & Units 09/16/2023    2:00 PM 05/09/2023    3:30 PM 05/19/2021    9:23 AM  BMP  Glucose 70 - 99 mg/dL 191  478  295   BUN 6 - 20 mg/dL 18  18  15    Creatinine 0.61 - 1.24 mg/dL 6.21  3.08  6.57   BUN/Creat Ratio 9 - 20   15   Sodium 135 - 145 mmol/L 135  135  138   Potassium 3.5 - 5.1 mmol/L 4.1  4.4  4.6   Chloride 98 - 111 mmol/L 102  98  102   CO2 22 - 32 mmol/L 21  24  20    Calcium  8.9 - 10.3 mg/dL 84.6  96.2  95.2         Latest Ref Rng & Units 09/16/2023    2:00 PM 05/19/2021    9:23 AM 03/29/2017   11:13 AM  Hepatic Function  Total Protein 6.5 - 8.1 g/dL 6.4  6.8  7.1   Albumin  3.5 - 5.0 g/dL 3.6  4.4  4.4   AST 15 - 41 U/L 29  27  23    ALT 0 - 44 U/L 59  50  39   Alk Phosphatase 38 - 126 U/L 53  88  86   Total Bilirubin 0.0 - 1.2 mg/dL 0.5  0.5  0.6   Bilirubin, Direct 0.0 - 0.2 mg/dL 0.1          Latest Ref Rng & Units 09/16/2023    2:00 PM 05/09/2023    3:30 PM 02/14/2017    8:38 AM  CBC  WBC 4.0 - 10.5 K/uL 8.3  7.0  6.2   Hemoglobin 13.0 - 17.0 g/dL 28.4  13.2  44.0   Hematocrit 39.0 - 52.0 % 46.5  44.8  43.1   Platelets  150 - 400 K/uL 264  344  262    Lab Results  Component Value Date   MCV 90.3 09/16/2023   MCV 91.8 05/09/2023   MCV 86 02/14/2017   Lab Results  Component Value Date   TSH 1.880 02/14/2017   Lab Results  Component Value Date   HGBA1C 7.6 (H) 09/16/2023     BNP No results found for: "BNP"  ProBNP No results found for: "PROBNP"   Lipid Panel     Component Value Date/Time   CHOL 148 05/19/2021 0923   TRIG 122 05/19/2021 0923   HDL 51 05/19/2021 0923   CHOLHDL 2.9 05/19/2021 0923   LDLCALC 75 05/19/2021 0923     RADIOLOGY: No results found.   Additional studies/ records that were reviewed today include:  I reviewed the records from [place] including office evaluations as well as laboratory.  ECHO Study Conclusions: 01/29/17   - Left ventricle: The cavity size was normal. Wall thickness was   increased in a pattern of moderate LVH. Systolic function was   normal. The estimated ejection fraction was in the range of 60%   to 65%. Wall motion was normal; there were no regional wall   motion abnormalities. There was no evidence of elevated   ventricular filling pressure by Doppler parameters. - Aortic valve: Trileaflet; mildly thickened, mildly calcified   leaflets. - Aorta: Aortic root dimension: 43 mm (ED). - Ascending aorta: The ascending aorta was mildly dilated. - Left atrium: The atrium was mildly dilated. - Right ventricle: The cavity size was mildly dilated. Wall   thickness was normal.   05/10/2022 CLINICAL DATA:  Thoracic aortic aneurysm without rupture   EXAM: CT ANGIOGRAPHY CHEST WITH CONTRAST   TECHNIQUE: Multidetector CT imaging of the chest was performed using the standard protocol during bolus administration of intravenous contrast. Multiplanar CT image reconstructions and MIPs were obtained to evaluate the vascular anatomy.   RADIATION DOSE REDUCTION: This exam was performed according to the departmental dose-optimization program which  includes automated exposure control, adjustment of the mA and/or kV according to patient size and/or use of iterative reconstruction technique.   CONTRAST:  OMNIPAQUE  IOHEXOL  350 MG/ML SOLN   COMPARISON:  06/06/2021 and previous   FINDINGS: Cardiovascular: Heart size normal. No pericardial effusion. Satisfactory opacification of pulmonary arteries noted, and there is no evidence of pulmonary emboli. Scattered coronary calcifications. Good contrast opacification of the thoracic aorta. No dissection or stenosis. 3-vessel brachiocephalic arterial origin anatomy without proximal stenosis.   Aortic Root:   --Valve: 2.9 cm   --Sinuses: 4.2 cm   --Sinotubular Junction: 3.9 cm   Limitations by motion: Mild   Thoracic Aorta:   --Ascending Aorta: 3.6 cm   --Aortic Arch: 3.5 cm (previously 3.4 cm)   --Descending Aorta: 2.9 cm   Mediastinum/Nodes: No mass or adenopathy.   Lungs/Pleura:  No pleural effusion. No pneumothorax. Somewhat geographic ground-glass pulmonary opacities presumably related to mild atelectasis. Previously noted 7 mm subpleural nodule in the medial basal segment right lower lobe is partially obscured by dependent atelectasis. No new nodule or confluent airspace disease.   Upper Abdomen: Fatty liver.  No acute findings.   Musculoskeletal: Anterior vertebral endplate spurring at multiple levels in the mid and lower thoracic spine.   Review of the MIP images confirms the above findings.   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 Recommend annual imaging followup by CTA or MRA. This recommendation follows 2010 ACCF/AHA/AATS/ACR/ASA/SCA/SCAI/SIR/STS/SVM Guidelines for the Diagnosis and Management of Patients with Thoracic Aortic Disease. Circulation.2010; 121: Z610-R604 2. Fatty liver    ASSESSMENT:    1. Essential hypertension   2. OSA (obstructive sleep apnea)   3. Stage III chronic kidney disease (HCC)   4. Hyperlipidemia  LDL goal <70   5. Obesity (BMI 30.0-34.9)   6. Type 2 diabetes mellitus without complication, without long-term current use of insulin  (HCC)   7. Obesity, Class II, BMI 35-39.9   8. Lumbar herniated disc; status post microdiscectomy and subsequent L4-L5 fusion     PLAN:  Mr. Ahmet Schank is a 57 year-old male who has a history of obesity and a >20 year history of hypertension and 8 year history of type 2 diabetes mellitus.  Despite medical management, his blood pressure continued to be elevated which led to his initial evaluation with me on 01/15/2017.  When I initially saw him, he already was on amlodipine  10 mg, lisinopril  HCT 40/25 mg, metoprolol  50 mg twice a day and I recommended the addition of spironolactone  initially at 12.5 mg daily.  When I saw him in follow-up, he felt improved, but due to slight continued blood pressure elevation spironolactone  further titrated to 25 mg prior to his knee surgery.  At that time he was on diclofenac and combination lisinopril  HCT.  Laboratory at Dr. Adelia [place] office had shown an increasing creatinine to 1.5.  With discontinuance of diclofenac and changing him to just lisinopril  alone renal function improved.  Subsequent creatinine was 1.23.  After not having seen him in over 3 years he had gained over 20 pounds during the COVID pandemic.  He also was having issues with sleep apnea and was inconsistent with CPAP use.  Most recently, his CPAP use has not been consistent mainly to a lot of his back issues and discomfort.  Since April use has significantly increased following his surgery.  His recent download shows that he is no longer using a ramp start pressure and his CPAP is set at a range of 13 to 20 cm.  When used, AHI is excellent at 1.3.  Recent average use was only 4 hours and 10 minutes per night.  He developed back discomfort and initially underwent microdiscectomy on May 15, 2019 for.  He reherniated his disc and required L4-L5 fusion on September 19, 2023.  Over the last several months he is now able to increase his activity and sleep somewhat better without his back discomfort.  He did have significant foot drop which is starting to improve.  Blood pressure today by me is stable and he states consistently blood pressure at home has systolic in the 120s with diastolic in the 80-85 range.  Blood pressure by me today was 128/82 on his medical regimen of amlodipine  10 mg, lisinopril  40 mg, metoprolol  100 mg, and spironolactone  25 mg.  He is on metformin  and  glyburide  for his diabetes mellitus.  I discussed with him my plans for retirement in the next several months.  Now that his back discomfort has improved hopefully he can have increased activity and significant weight loss.  I will transition him to the care of Dr. Alvis Ba I will arrange follow-up in 9 months or sooner if issues arise.    Medication Adjustments/Labs and Tests Ordered: Current medicines are reviewed at length with the patient today.  Concerns regarding medicines are outlined above.  Medication changes, Labs and Tests ordered today are listed in the Patient Instructions below. Patient Instructions  Medication Instructions:  NO CHANGES *If you need a refill on your cardiac medications before your next appointment, please call your pharmacy*  Lab Work: NO LABS If you have labs (blood work) drawn today and your tests are completely normal, you will receive your results only by: MyChart Message (if you have MyChart) OR A paper copy in the mail If you have any lab test that is abnormal or we need to change your treatment, we will call you to review the results.  Testing/Procedures: NO TESTING  Follow-Up: At [facility], you and your health needs are our priority.  As part of our continuing mission to provide you with exceptional heart care, our providers are all part of one team.  This team includes your primary Cardiologist (physician) and Advanced Practice Providers  or APPs (Physician Assistants and Nurse Practitioners) who all work together to provide you with the care you need, when you need it.  Your next appointment:   9 month(s)  Provider:   Luana Rumple, MD  Other Instructions   1st Floor: - Lobby - Registration  - Pharmacy  - Lab - Cafe  2nd Floor: - PV Lab - Diagnostic Testing (echo, CT, nuclear med)  3rd Floor: - Vacant  4th Floor: - TCTS (cardiothoracic surgery) - AFib Clinic - Structural Heart Clinic - Vascular Surgery  - Vascular Ultrasound  5th Floor: - HeartCare Cardiology (general and EP) - Clinical Pharmacy for coumadin, hypertension, lipid, weight-loss medications, and med management appointments    Valet parking services will be available as well.      Signed, Magnus Schuller, MD  12/12/2023 5:25 PM    [facility] [address], [address], [place], [place]  [address] Phone: [phone]

## 2024-02-27 ENCOUNTER — Encounter (HOSPITAL_BASED_OUTPATIENT_CLINIC_OR_DEPARTMENT_OTHER): Payer: Self-pay | Admitting: Cardiovascular Disease

## 2024-02-28 ENCOUNTER — Encounter (HOSPITAL_BASED_OUTPATIENT_CLINIC_OR_DEPARTMENT_OTHER): Payer: Self-pay | Admitting: Cardiovascular Disease

## 2024-02-29 ENCOUNTER — Other Ambulatory Visit: Payer: Self-pay | Admitting: Cardiovascular Disease

## 2024-02-29 DIAGNOSIS — I10 Essential (primary) hypertension: Secondary | ICD-10-CM

## 2024-02-29 DIAGNOSIS — E66811 Obesity, class 1: Secondary | ICD-10-CM

## 2024-02-29 DIAGNOSIS — E119 Type 2 diabetes mellitus without complications: Secondary | ICD-10-CM

## 2024-02-29 DIAGNOSIS — N183 Chronic kidney disease, stage 3 unspecified: Secondary | ICD-10-CM

## 2024-03-02 ENCOUNTER — Encounter: Payer: Self-pay | Admitting: Physician Assistant

## 2024-03-02 DIAGNOSIS — E119 Type 2 diabetes mellitus without complications: Secondary | ICD-10-CM

## 2024-03-02 DIAGNOSIS — N183 Chronic kidney disease, stage 3 unspecified: Secondary | ICD-10-CM

## 2024-03-02 DIAGNOSIS — E66811 Obesity, class 1: Secondary | ICD-10-CM

## 2024-03-02 DIAGNOSIS — I10 Essential (primary) hypertension: Secondary | ICD-10-CM

## 2024-03-02 NOTE — Telephone Encounter (Signed)
 Please refill those two medications

## 2024-03-03 MED ORDER — LISINOPRIL 40 MG PO TABS: 40.0000 mg | ORAL_TABLET | Freq: Every day | ORAL | 2 refills | Status: AC

## 2024-03-03 MED ORDER — SPIRONOLACTONE 25 MG PO TABS
25.0000 mg | ORAL_TABLET | Freq: Every day | ORAL | 2 refills | Status: AC
Start: 2024-03-03 — End: ?

## 2024-08-23 ENCOUNTER — Encounter: Payer: Self-pay | Admitting: Physician Assistant

## 2024-08-23 ENCOUNTER — Other Ambulatory Visit: Payer: Self-pay | Admitting: Cardiovascular Disease

## 2024-08-25 MED ORDER — METOPROLOL SUCCINATE ER 100 MG PO TB24: 100.0000 mg | ORAL_TABLET | Freq: Every day | ORAL | 0 refills | Status: AC

## 2024-08-26 ENCOUNTER — Encounter: Payer: Self-pay | Admitting: Cardiovascular Disease

## 2024-11-16 ENCOUNTER — Ambulatory Visit: Admitting: Cardiovascular Disease
